# Patient Record
Sex: Female | Born: 1960 | Race: Black or African American | Hispanic: No | Smoking: Never smoker
Health system: Southern US, Community
[De-identification: ages and names within clinical notes are randomized; demographics above are authoritative.]

## PROBLEM LIST (undated history)

## (undated) DIAGNOSIS — R011 Cardiac murmur, unspecified: Secondary | ICD-10-CM

## (undated) DIAGNOSIS — I10 Essential (primary) hypertension: Secondary | ICD-10-CM

## (undated) DIAGNOSIS — Z9889 Other specified postprocedural states: Secondary | ICD-10-CM

## (undated) DIAGNOSIS — K6389 Other specified diseases of intestine: Secondary | ICD-10-CM

## (undated) DIAGNOSIS — R112 Nausea with vomiting, unspecified: Secondary | ICD-10-CM

## (undated) DIAGNOSIS — D649 Anemia, unspecified: Secondary | ICD-10-CM

## (undated) DIAGNOSIS — T4145XA Adverse effect of unspecified anesthetic, initial encounter: Secondary | ICD-10-CM

## (undated) DIAGNOSIS — T8859XA Other complications of anesthesia, initial encounter: Secondary | ICD-10-CM

## (undated) HISTORY — PX: LYMPH NODE BIOPSY: SHX201

## (undated) HISTORY — PX: BREAST EXCISIONAL BIOPSY: SUR124

## (undated) HISTORY — PX: ABDOMINAL HYSTERECTOMY: SHX81

## (undated) HISTORY — DX: Essential (primary) hypertension: I10

## (undated) HISTORY — PX: OTHER SURGICAL HISTORY: SHX169

## (undated) HISTORY — DX: Cardiac murmur, unspecified: R01.1

---

## 2001-12-18 ENCOUNTER — Encounter: Payer: Self-pay | Admitting: Obstetrics and Gynecology

## 2001-12-18 ENCOUNTER — Ambulatory Visit (HOSPITAL_COMMUNITY): Admission: RE | Admit: 2001-12-18 | Discharge: 2001-12-18 | Payer: Self-pay | Admitting: Obstetrics and Gynecology

## 2002-01-10 ENCOUNTER — Encounter (INDEPENDENT_AMBULATORY_CARE_PROVIDER_SITE_OTHER): Payer: Self-pay | Admitting: Specialist

## 2002-01-10 ENCOUNTER — Ambulatory Visit (HOSPITAL_COMMUNITY): Admission: RE | Admit: 2002-01-10 | Discharge: 2002-01-10 | Payer: Self-pay | Admitting: Obstetrics and Gynecology

## 2003-04-20 ENCOUNTER — Other Ambulatory Visit: Admission: RE | Admit: 2003-04-20 | Discharge: 2003-04-20 | Payer: Self-pay | Admitting: Obstetrics and Gynecology

## 2003-05-27 ENCOUNTER — Encounter: Admission: RE | Admit: 2003-05-27 | Discharge: 2003-05-27 | Payer: Self-pay | Admitting: Obstetrics and Gynecology

## 2004-03-29 ENCOUNTER — Encounter (INDEPENDENT_AMBULATORY_CARE_PROVIDER_SITE_OTHER): Payer: Self-pay | Admitting: Specialist

## 2004-03-29 ENCOUNTER — Observation Stay (HOSPITAL_COMMUNITY): Admission: RE | Admit: 2004-03-29 | Discharge: 2004-03-30 | Payer: Self-pay | Admitting: Obstetrics and Gynecology

## 2005-12-22 ENCOUNTER — Encounter: Admission: RE | Admit: 2005-12-22 | Discharge: 2005-12-22 | Payer: Self-pay | Admitting: Family Medicine

## 2006-07-06 ENCOUNTER — Encounter: Admission: RE | Admit: 2006-07-06 | Discharge: 2006-07-06 | Payer: Self-pay | Admitting: Otolaryngology

## 2006-07-06 ENCOUNTER — Encounter (INDEPENDENT_AMBULATORY_CARE_PROVIDER_SITE_OTHER): Payer: Self-pay | Admitting: Specialist

## 2006-07-06 ENCOUNTER — Ambulatory Visit (HOSPITAL_COMMUNITY): Admission: RE | Admit: 2006-07-06 | Discharge: 2006-07-06 | Payer: Self-pay | Admitting: Otolaryngology

## 2007-06-12 ENCOUNTER — Encounter: Admission: RE | Admit: 2007-06-12 | Discharge: 2007-06-12 | Payer: Self-pay | Admitting: Family Medicine

## 2007-11-13 ENCOUNTER — Encounter: Admission: RE | Admit: 2007-11-13 | Discharge: 2007-11-13 | Payer: Self-pay | Admitting: Family Medicine

## 2008-06-12 ENCOUNTER — Encounter: Admission: RE | Admit: 2008-06-12 | Discharge: 2008-06-12 | Payer: Self-pay | Admitting: Family Medicine

## 2010-11-11 NOTE — H&P (Signed)
Amber, Davidson               ACCOUNT NO.:  [id number]   MEDICAL RECORD NO.:  [id number]          PATIENT TYPE:  AMB   LOCATION:  SDC                           FACILITY:  [facility]   PHYSICIAN:  Hal Morales, M.D.DATE OF BIRTH:  05/30/1961   DATE OF ADMISSION:  DATE OF DISCHARGE:                                HISTORY & PHYSICAL   ADDENDUM TO PREVIOUS H&P DICTATION [phone]   LABORATORY DATA:  The patient had a urine pregnancy test on March 22, 2004 which was negative.      EJP/MEDQ  D:  03/23/2004  T:  03/23/2004  Job:  [id number]

## 2010-11-11 NOTE — H&P (Signed)
[facility]  Patient:    Amber Davidson, POL Visit Number: [id number] MRN: [id number]          Service Type: Attending:  Maris Berger. Pennie Rushing, M.D. Dictated by:   Henreitta Leber, P.A.                           History and Physical  INCOMPLETE  DATE OF BIRTH:                30-Mar-1961  HISTORY OF PRESENT ILLNESS:   The patient is a 50 year old married African-American female with new onset dysmenorrhea of seven months duration. Patient states that she has begun to experience one day before her menstrual period severe menstrual cramps which she rates as a 10/10 on a 10-point scale which lasts throughout her three to five day flow.  Patient was found to have a normal pelvic ultrasound on December 19, 2001, along with Dictated by:   Henreitta Leber, P.A. Attending:  Maris Berger. Pennie Rushing, M.D. DD:  01/06/02 TD:  01/06/02 Job: [id number]

## 2010-11-11 NOTE — Op Note (Signed)
[facility]  Patient:    Amber Davidson, Amber Davidson Visit Number: [id number] MRN: [id number]          Service Type: DSU Location: [facility] Attending Physician:  Shaune Spittle Dictated by:   Maris Berger. Pennie Rushing, M.D. Proc. Date: 01/10/02 Admit Date:  01/10/2002 Discharge Date: 01/10/2002                             Operative Report  PREOPERATIVE DIAGNOSES:       Pelvic pain.  POSTOPERATIVE DIAGNOSES:      Pelvic pain, small uterine fibroid, pelvic adhesions, rule out endometriosis.  OPERATION:                    Diagnostic laparoscopy, uterine serosal biopsy, lysis of adhesions.  ANESTHESIA:                   General orotracheal.  ESTIMATED BLOOD LOSS:         Less than 10 cc.  COMPLICATIONS:                None.  FINDINGS:                     The uterus was normal size and contained a 2 cm posterior fundal fibroid.  Along the serosa of the uterus were small white lesions each less than 1 mm.  These same types of lesions were found along each of the fallopian tubes.  The anterior cul-de-sac contained no stigmata of endometriosis or adhesions.  The posterior cul-de-sac contained filmy adhesions between the posterior uterus and the posterior cul-de-sac peritoneum.  There were no powder burn lesions.  There were no peritoneal windows or defects that were consistent with endometriosis.  The right fallopian tube was without any adhesions.  There was an adhesion between the right round ligament and the right utero-ovarian ligament encircling the right fallopian tube.  The right and left ovaries appeared normal.  There were filmy adhesions surrounding the left ovary.  The left tube contained filmy adhesions at the fimbriated end.  The appendix appeared normal.  PROCEDURE:                    The patient was taken to the operating room after appropriate identification and placed on the operating table.  After the attainment of adequate general  anesthesia, she was placed in the modified lithotomy position.  The abdomen, perineum, and vagina were prepped with multiple layers of Betadine and a Foley catheter placed into the bladder under sterile conditions and connected to straight drainage.  A single tooth tenaculum was placed on the anterior cervix.  The abdomen was draped as a sterile field.  Subumbilical and suprapubic injections of 0.25% Marcaine for a total of 10 cc was undertaken.  A subumbilical incision was made.  A Veress cannula was placed through that incision into the peritoneal cavity.  A pneumoperitoneum was created with 3 L of CO2.  The Veress cannula was removed and the laparoscopic trocar placed through the umbilical incision and a laparoscope placed through the trocar sleeve.  Suprapubic incisions were made to the right and left of midline and laparoscopic probe trocars placed through those incisions into the peritoneal cavity under direct visualization.  The above noted findings were made and documented.  The adhesions were lysed and as many of them as possible removed from the operative field and sent  for evaluation of endometriosis.  A biopsy from the posterior uterus of one of the aforementioned white lesions was then undertaken and the defect made hemostatic with bipolar cautery.  Copious irrigation was carried out and once lysis of adhesions was deemed to be complete, 100 cc of warm lactated Ringers was left in the peritoneal cavity and the instruments were removed from the peritoneal cavity under direct visualization as the CO2 was allowed to escape. During the operative procedure the patient was given 2 g of Cefotan intravenously.  Subumbilical and suprapubic incisions were closed with subcuticular sutures of 3-0 Vicryl.  Sterile dressings were applied.  The single tooth tenaculum and Foley catheter were removed.  Silver nitrate was used at the tenaculum site to allow adequate hemostasis.  The patient  was awakened from general anesthesia and taken to the recovery room in satisfactory condition having tolerated the procedure well with sponge and instrument counts correct.  SPECIMEN:                     Peritoneal adhesions and uterine biopsy. Dictated by:   Maris Berger. Pennie Rushing, M.D. Attending Physician:  Shaune Spittle DD:  01/10/02 TD:  01/15/02 Job: [id number]

## 2010-11-11 NOTE — H&P (Signed)
[facility]  Patient:    Amber Davidson, Amber Davidson Visit Number: [id number] MRN: [id number]          Service Type: Attending:  Maris Berger. Pennie Rushing, M.D. Dictated by:   Henreitta Leber, P.A.-C. Adm. Date:  01/10/02                           History and Physical  DATE OF BIRTH:                07-29-1960  HISTORY OF PRESENT ILLNESS:   Amber Davidson is a 50 year old married African-American female with a seven-month history of new onset dysmenorrhea. The patient noted that one day prior to her menstrual period throughout her 3 to 5-day flow, she has what she describes as 10/10 intense menstrual cramps (on a 10-point scale) which is only minimally relieved by a heating pad and over-the-counter analgesia.  The patients flow requires her to utilize four maxipads per day and, as previously mentioned, may last as long as five days. She underwent a pelvic ultrasound on December 19, 2001, and was found to have normal pelvic structures.  A GC and chlamydia culture of January 02, 2002, was found to be negative.  The patient has consented to undergo diagnostic laparoscopy for further evaluation of her pain.  PAST OBSTETRICAL HISTORY:     Gravida 3, para 2-0-1-2; the patient experienced anemia during her pregnancies.  GYNECOLOGIC HISTORY:          Menarche at 50 years old.  The patients menstrual periods are regular with severe cramps (see History of Present Illness).  Contraception: None.  The patient has a remote history of gonorrhea and Trichomonas.  She has no history of an abnormal Pap smear.  She does have a history of a left ovarian cyst.  The patients last Pap smear and mammogram were June 2003, and both were within normal limits.  PAST MEDICAL HISTORY:         Positive for hypertension.  PAST SURGICAL HISTORY:        Negative.  The patient has never had a blood transfusion.  FAMILY HISTORY:               Positive for heart disease, breast cancer (maternal aunt and  mother, both postmenopausal), sarcoidosis, and hypertension.  SOCIAL HISTORY:               The patient is married and works as an Statistician.  The patient does not use tobacco or alcohol.  CURRENT MEDICATIONS:          Triamterene/HCTZ 37.5/25 1 tablet daily.  ALLERGIES:                    The patient is allergic to PENICILLIN which causes hives.  REVIEW OF SYSTEMS:            Positive for biweekly frontal headache, dyspareunia.  The patient does wear glasses.  The patient has problems with constipation on occasion.  Otherwise, except as mentioned in History of Present Illness, Review of Systems is negative.  PHYSICAL EXAMINATION:  VITAL SIGNS:                  Blood pressure 100/80, weight 169.  Height 5 feet 6 inches.  HEENT:                        Ear, nose, and  throat exam is within normal limits.  NECK:                         Supple without masses.  There is no thyromegaly.  HEART:                        Regular rate and rhythm.  There is no murmur.  LUNGS:                        Clear to auscultation.  There are no wheezes, rales, or rhonchi.  BACK:                         Without CVA tenderness.  ABDOMEN:                      Bowel sounds are present.  It is soft without tenderness or hepatosplenomegaly.  EXTREMITIES:                  Without clubbing, cyanosis, or edema.  PELVIC:                       EG/BUS within normal limits.  Vagina is normal. Cervix is nontender without lesions.  Uterus appears upper limits of normal size and is tender.  Adnexa has no palpable masses, though the patient does have right adnexal tenderness.  Rectovaginal exam confirms.  IMPRESSION:                   Severe dysmenorrhea.  DISPOSITION:                  The implications for, along with the risks associated with chosen procedure was reviewed with the patient to include reaction to anesthesia, damage to adjacent organs, infection, and bleeding. The patient has  consented to undergo laparoscopy at [facility] [place] on January 10, 2002, at 12:15 p.m. Dictated by:   Henreitta Leber, P.A.-C. Attending:  Maris Berger. Pennie Rushing, M.D. DD:  01/06/02 TD:  01/06/02 Job: [id number]

## 2010-11-11 NOTE — Op Note (Signed)
Amber Davidson, Amber Davidson               ACCOUNT NO.:  [id number]   MEDICAL RECORD NO.:  [id number]          PATIENT TYPE:  AMB   LOCATION:  SDS                          FACILITY:  [facility]   PHYSICIAN:  Karol T. Lazarus Salines, M.D. DATE OF BIRTH:  January 17, 1961   DATE OF PROCEDURE:  07/06/2006  DATE OF DISCHARGE:  07/06/2006                               OPERATIVE REPORT   PREOPERATIVE DIAGNOSIS:  Right neck suppurative lymphadenitis.   POSTOPERATIVE DIAGNOSIS:  Right neck suppurative lymphadenitis.   PROCEDURE PERFORMED:  Incision and drainage right neck suppurative node  including tissue biopsy and cultures.   SURGEON:  Gloris Manchester. Lazarus Salines, M.D.   ANESTHESIA:  General LMA.   ESTIMATED BLOOD LOSS:  5 mL.   COMPLICATIONS:  None.   FINDINGS:  Firm inflammatory mass in the right neck level 2 deep to the  sternocleidomastoid muscle.  A frank pus containing access cavity in a  thick wall node containing perhaps 3-4 mL of pus.  Cultures were sent  for aerobic anaerobic, acid fast, fungus.  Biopsy of the wall sent for  pathologic interpretation.   DESCRIPTION OF PROCEDURE:  With the patient in a comfortable supine  position, general LMA anesthesia was administered without difficulty.  At an appropriate level, the patient was placed in a slight reversed  Trendelenburg.  The head was rotated to the left for access to the right  neck.  A skin wrinkle was identified.  1% Xylocaine with 1:100,000  epinephrine, 4 mL total, was infiltrated into the skin wrinkle for  interoperative hemostasis.  A full sterile preparation and draping of  the neck was accomplished.   A 3 cm incision was sharply executed in the previously identified  wrinkle and carried down to the skin and subcutaneous fat.  This was  carried through the platysmal muscle.  A branch of the external jugular  vein was identified and controlled with silk ligatures.  Upon arriving  at the capsule of the mass, a hemostat was used to dissect  bluntly  through the tissue and a pus containing cavity was entered.  Cultures,  as above, were obtained.  The opening was bluntly enlarged.  The cavity  was irrigated with saline.  Hemostasis was observed.  The cavity was  packed with 1/2 inch Iodoform Nugauze approximately 12 inches.  Hemostasis was observed again.  The external wound was loosely closed at  the corners with 4-0 Ethilon vertical mattress sutures.  The Iodoform  Nugauze was secured at the center of the wound with the same suture.  Again, hemostasis was observed.  A 4 inch Kerlix fluff and 3 inch Ace  wrap dressing was applied.  The patient was returned to anesthesia,  awakened, extubated, and transferred to recovery in stable condition.   COMMENT:  50 year old black female with a several week history of tender  swelling in the right neck, rapidly progressive over the past 3-4 days  and failing to show dramatic improvement on clindamycin therapy with a  CT scan showing an abscessed cavity in a presumed lymph node was the  indications for today's procedure.  Anticipated  routine postoperative recovery with attention to ice, elevation,  dressing changes, and continued antibiotics and pain medication.  Given  low anticipated risk of post anesthetic or post surgical complications,  I feel an outpatient venue is appropriate.      Gloris Manchester. Lazarus Salines, M.D.  Electronically Signed     KTW/MEDQ  D:  07/06/2006  T:  07/07/2006  Job:  [id number]   cc:   Gretta Arab. Valentina Lucks, M.D.

## 2010-11-11 NOTE — Op Note (Signed)
Amber Davidson, Amber Davidson               ACCOUNT NO.:  [id number]   MEDICAL RECORD NO.:  [id number]          PATIENT TYPE:  OBV   LOCATION:  9399                          FACILITY:  [facility]   PHYSICIAN:  Hal Morales, M.D.DATE OF BIRTH:  [birth year]   DATE OF PROCEDURE:  03/29/2004  DATE OF DISCHARGE:                                 OPERATIVE REPORT   PREOPERATIVE DIAGNOSIS:  1.  Pelvic pain.  2.  History of pelvic adhesions.   POSTOPERATIVE DIAGNOSIS:  1.  Pelvic pain.  2.  History of pelvic adhesions.  3.  Right corpus luteum cyst.   PROCEDURE:  Diagnostic laparoscopy, total vaginal hysterectomy.   SURGEON:  Hal Morales, M.D.   FIRST ASSISTANT:  Osborn Coho, M.D.   ANESTHESIA:  General orotracheal.   ESTIMATED BLOOD LOSS:  300 mL.   COMPLICATIONS:  None.   FINDINGS:  The uterus was upper limits of normal size with small myomata  that was subserosal.  The left ovary was within normal limits.  The right  ovary contained a 3 cm corpus luteum cyst without any excrescences or  adhesions.  The uterus weighed 170.6 g.   DESCRIPTION OF PROCEDURE:  The procedure was taken to the operating room  after appropriate identification and placed on the operating table.  After  the attainment of adequate general anesthesia, she was placed in the  modified lithotomy position.  The abdomen, perineum and vagina were prepped  with multiple layers of Betadine, and Foley catheter inserted into the  bladder and connected to straight drainage.  A Hulka tenaculum was placed on  the cervix and the abdomen and peritoneum draped as a sterile field.  Subumbilical and suprapubic injection of 0.25% Marcaine was undertaken and a  subumbilical incision was made.  A Veress cannula was placed through that  incision into the peritoneal cavity.  Pneumoperitoneum was created with 4  liters of CO2.  The Veress cannula was removed and laparoscopic trocar  placed through that incision into the peritoneal  cavity.  The laparoscope  was placed through the trocar sleeve.  Suprapubic incisions were made to the  right and left of midline and laparoscopic probe trocars placed through  those incisions into the peritoneal cavity under direct visualization.  The  above noted findings were made and documented.  The right utero-ovarian  ligament and uterotubal junction were cauterized and cut.  The right round  ligament was cauterized and cut.  At that time, decision was made to proceed  with total vaginal hysterectomy.  All laparoscopic instruments were left in  place and the perineum became the site of operation.  A weighted speculum  was placed in the vagina and Lahey tenacula placed on the anterior and  posterior lips of the cervix.  The cervical vaginal mucosa was injected with  a dilute solution of Pitressin.  The circumflex was circumscribed and the  anterior vaginal mucosa was bluntly dissected off the anterior cervix.  The  anterior peritoneum was entered sharply and a retractor placed.  The  posterior peritoneum was sharply entered and tagged and a weighted vaginal  speculum was placed in the posterior cul-de-sac.  The uterosacral ligaments  on the right and left side were clamped, cut and suture ligated.  The  paracervical tissues were clamped, cut and suture ligated.  The parametrial  tissues were then clamped, cut and suture ligated, all with sutures of 0  Vicryl.  At that time, the uterus was morcellated and the upper pedicles  reached.  Since they had been cauterized on the right side, the left side  was then clamped, cut, and tied with tied with free tie and suture ligated  and the remaining uterus removed from the operative field.  Hemostatic  sutures were placed in the vaginal cuff to allow for adequate hemostasis.  The sutures that had been held on the uterosacral ligaments were used to  create the vaginal angles anteriorly and posteriorly and tied down to free  the vaginal angles.   McCall culdoplasty sutures were then placed in the  posterior cul-de-sac incorporating the two uterosacral ligaments and the  intervening posterior peritoneum.  Those sutures were held.  The remainder  of the vaginal cuff was closed in a single layer incorporating anterior  vaginal mucosa, anterior peritoneum, posterior peritoneum and posterior  vaginal mucosa in each figure-of-eight suture of 0 Vicryl.  Once the cuff  had been closed, the McCall culdoplasty sutures were tied down and  hemostasis noted to be adequate.  Vaginal packing with 2 inch plain  moistened with Estrace cream was placed in the vagina.  At that time, I  returned to the laparoscopy and was able to visualize the pelvis and noted  it to be hemostatic.  Irrigation was carried out and all instruments removed  from the peritoneal cavity under direct visualization as the CO2 was allowed  to escape.  The subumbilical and right suprapubic incision were closed with  fascial sutures of 0 Vicryl and subcuticular sutures of 3-0 Vicryl.  The  left suprapubic incision was closed with a subcuticular suture of 3-0  Vicryl.  Hemostasis was noted to be adequate.  Sterile dressings were  applied.  The patient was awakened from general anesthesia and taken to the  recovery room in satisfactory condition having tolerated the procedure well  with sponge and instrument counts correct.  Specimens to pathology were  morcellated uterus and cervix.      VPH/MEDQ  D:  03/29/2004  T:  03/29/2004  Job:  [id number]

## 2010-11-11 NOTE — H&P (Signed)
NAMEGEOVANNA, Davidson               ACCOUNT NO.:  [id number]   MEDICAL RECORD NO.:  [id number]          PATIENT TYPE:  AMB   LOCATION:  SDC                           FACILITY:  [facility]   PHYSICIAN:  Hal Morales, M.D.DATE OF BIRTH:  October 13, 1960   DATE OF ADMISSION:  DATE OF DISCHARGE:                                HISTORY & PHYSICAL   HISTORY OF PRESENT ILLNESS:  [name] is a 50 year old married African-  American female para 2-0-1-2 who presents for a laparoscopic-assisted  vaginal hysterectomy because of pelvic pain.  Mrs. Lemarr has a long history  of dysmenorrhea with pelvic pain which was evaluated laparoscopically in  2003.  Findings of that procedure showed adhesions but no endometriosis.  The patient was placed on Depo-Provera following that procedure which  yielded relief of her pain, along with amenorrhea.  The patient received her  last dose of Depo-Provera in November 2003 which she stopped because of  severe vasomotor symptoms in spite of the addition of Climara patch 0.025 mg  weekly.  The patient remained essentially amenorrheic with periodic spotting  until August 2005 when she bled approximately 10 consecutive days, having to  change her pad hourly.   For the past 7 months the patient has noticed an almost daily crampy pain  which she rates as a 5/10 on a 10-point scale which will then increase in a  cyclical fashion to 9/10 (when she normally would expect a period).  The  patient has only found relief with Ponstel 250 mg which decreases her daily  pain to 3/10 and her episodic exacerbation to 7/10 on a 10-point scale.  The  patient denies any change in her bowel habits, urinary tract symptoms,  nausea, vomiting, diarrhea, fever, and she also denies having any sexual  activity.  During this time of the patient being evaluated for pelvic pain  and menstrual irregularity, she has had negative gonorrhea and chlamydia  cultures, normal TSH and prolactin, and a  nonmenopausal FSH.  Due to the  chronicity of her pain and disruption of her lifestyle the patient, after  reviewing both medical and surgical options for management, has decided to  proceed with definitive therapy in the form of hysterectomy.   PAST MEDICAL HISTORY:  1.  OB history:  Gravid 3 para 2-0-1-2.  The patient had anemia during      pregnancy.  2.  GYN history:  Menarche at 50 years old.  The patient's normal menstrual      flow (prior to Depo-Provera) was for 3 days in which she used three pads      per day.  Her last menstrual period is February 04, 2004.  The patient      uses abstinence as a method of contraception.  She has a remote history      of gonorrhea and Trichomonas.  Denies any history of abnormal Pap      smears.  Last normal Pap smear was November 2004.  Last normal mammogram      December 2004.  3.  Medical history:  Positive for left ovarian cyst  and hypertension.  4.  Surgical history:  In 1987, D&C.  In 2003, laparoscopy for pelvic pain,      revealing pelvic adhesions.  Denies any history of blood transfusion.      The patient does state that general anesthesia causes her severe nausea      and difficulty awakening postoperatively.   FAMILY HISTORY:  Positive for heart disease, breast cancer (postmenopausal  maternal aunt and mother), sarcoidosis, and hypertension.   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   HABITS:  The patient does not smoke or use alcohol.   CURRENT MEDICATIONS:  1.  Ponstel 250 mg one tablet q.6h. as needed for pain.  2.  Maxzide 37.5/25 one tablet daily.   ALLERGIES:  The patient is allergic to PENICILLIN which causes a rash, and  VICODIN which causes severe nausea and vomiting.   REVIEW OF SYSTEMS:  Negative except as mentioned in the history of present  illness.   PHYSICAL EXAMINATION:  VITAL SIGNS:  Blood pressure 118/80, weight is 180,  height is 5 feet 6  inches tall.  NECK:  Supple.  There is no thyromegaly, adenopathy, or palpable masses.  HEART:  Regular rate and rhythm, there is no murmur.  LUNGS:  Clear to auscultation.  There are no wheezes, rales, or rhonchi.  BACK:  No CVA tenderness.  ABDOMEN:  Bowel sounds are present.  It is soft.  The patient does have  right lower quadrant tenderness.  However, there is no guarding, rebound, or  organomegaly.  EXTREMITIES:  Without clubbing, cyanosis, or edema.  PELVIC:  EG/BUS is within normal limits.  The vagina is rugous.  Cervix is  nontender without lesions.  The uterus is normal size, shape, and  consistency, and tender.  Adnexa without any palpable masses, though the  right adnexa is tender.  Rectovaginal exam confirms.   IMPRESSION:  1.  Pelvic pain.  2.  History of pelvic adhesions.   DISPOSITION:  A discussion was held with the patient regarding the  indications for her procedure as well as the fact that total relief from her  pelvic pain could not be guaranteed with this procedure.  The patient  verbalized understanding and is willing to accept the risks, which include  but are not limited to:  Reaction to anesthesia, damage to adjacent organs,  infection, and excessive bleeding.  The patient was given a copy of the ACOG  brochure entitled, Understanding Hysterectomy.  She was also advised and  given instructions for a Fleets Phospho-Soda bowel prep which she will  administer the day prior to surgery.  The patient has consented and is  scheduled to undergo a laparoscopic-assisted vaginal hysterectomy at [facility] [place] on March 29, 2004.      EJP/MEDQ  D:  03/23/2004  T:  03/23/2004  Job:  [id number]

## 2010-11-11 NOTE — Discharge Summary (Signed)
Amber Davidson, Amber Davidson               ACCOUNT NO.:  [id number]   MEDICAL RECORD NO.:  [id number]          PATIENT TYPE:  OBV   LOCATION:  9308                          FACILITY:  [facility]   PHYSICIAN:  Amber Davidson, M.D.DATE OF BIRTH:  1961-03-15   DATE OF ADMISSION:  03/29/2004  DATE OF DISCHARGE:  03/30/2004                                 DISCHARGE SUMMARY   DISCHARGE DIAGNOSES:  1.  Pelvic pain.  2.  History of pelvic adhesions.  3.  Right corpus luteum cyst.   OPERATION:  On the day of admission, the patient underwent diagnostic  laparoscopy and a total vaginal hysterectomy, tolerating both procedures  well.  The patient was found to have a uterus which was upper limits of  normal size with small myomata that was subserosal.  The left ovary was  within normal limits.  The right ovary contained a 3 cm corpus luteum cyst  without any excrescences or adhesions.  The uterus weighed 170.6 g.   HISTORY OF PRESENT ILLNESS:  Amber Davidson is a 50 year old married African  American female para 2-0-1-2 who presents for laparoscopically assisted  vaginal hysterectomy because of pelvic pain.  Please see the patient's  dictated History and Physical examination for details.   PREOPERATIVE PHYSICAL EXAMINATION:  VITAL SIGNS:  Blood pressure 118/80,  weight 180, height 5 feet 6 inches tall.  GENERAL APPEARANCE:  Within normal limits.  ABDOMEN:  Bowel sounds were present.  Abdomen was soft.  Some right lower  quadrant tenderness without guarding, rebound or organomegaly.  PELVIC:  EG/BUS was within normal limits.  Vagina was rugose.  Cervix was  nontender without lesions.  Uterus was normal size, shape and consistency  and tender.  Adnexa without any palpable masses.  Though, the right adnexa  was somewhat tender.  RECTAL/VAGINAL:  Confirmed.   HOSPITAL COURSE:  On the day of admission, the patient underwent  aforementioned procedures, tolerating them all well.  Her postop hemoglobin  was 10.6  (preop hemoglobin 13.9).  By postop day #1, the patient had resumed  bowel and bladder function and had therefore achieved a maximum benefit of  her hospital stay, and was therefore being admitted for discharge home.   DISCHARGE MEDICATIONS:  1.  Maxzide 37.5/25 one tablet daily.  2.  Ferrous sulfate 325 mg one tablet daily.  3.  Percocet 5/325 1-2 q.4h. p.r.n. pain.  4.  Ibuprofen 600 mg one tablet q.6h. for three days then as needed.   FOLLOWUP:  The patient is to call [facility] for her six weeks  postoperative visit with Dr. Pennie Rushing.   DISCHARGE INSTRUCTIONS:  The patient was given a copy of [facility] postoperative instruction sheet.  She was further advised to avoid  driving for two weeks, heavy lifting for four weeks and intercourse for six  weeks.  The patient's diet was without restriction.   FINAL PATHOLOGY:  UTERUS AND CERVIX:  Cervix:  Squamous metaplasia,  secretory endometrium, no hyperplasia or carcinoma identified, benign  uterine serosa.  No endometriosis or malignancy identified.     Elmi  EJP/MEDQ  D:  04/12/2004  T:  04/13/2004  Job:  [id number]

## 2011-02-06 ENCOUNTER — Other Ambulatory Visit (HOSPITAL_COMMUNITY): Payer: Self-pay | Admitting: Family Medicine

## 2011-02-06 DIAGNOSIS — Z1231 Encounter for screening mammogram for malignant neoplasm of breast: Secondary | ICD-10-CM

## 2011-02-16 ENCOUNTER — Other Ambulatory Visit (HOSPITAL_COMMUNITY): Payer: Self-pay | Admitting: Family Medicine

## 2011-02-16 DIAGNOSIS — Z1231 Encounter for screening mammogram for malignant neoplasm of breast: Secondary | ICD-10-CM

## 2011-02-17 ENCOUNTER — Ambulatory Visit (HOSPITAL_COMMUNITY): Payer: Self-pay

## 2011-02-17 ENCOUNTER — Ambulatory Visit
Admission: RE | Admit: 2011-02-17 | Discharge: 2011-02-17 | Disposition: A | Discharge status: Home / Self Care | Payer: BC Managed Care – PPO | Source: Ambulatory Visit | Attending: Family Medicine | Admitting: Family Medicine

## 2011-02-17 DIAGNOSIS — Z1231 Encounter for screening mammogram for malignant neoplasm of breast: Secondary | ICD-10-CM

## 2012-11-01 ENCOUNTER — Other Ambulatory Visit: Payer: Self-pay

## 2012-11-01 DIAGNOSIS — Z1231 Encounter for screening mammogram for malignant neoplasm of breast: Secondary | ICD-10-CM

## 2012-12-16 ENCOUNTER — Ambulatory Visit
Admission: RE | Admit: 2012-12-16 | Discharge: 2012-12-16 | Disposition: A | Discharge status: Home / Self Care | Payer: BC Managed Care – PPO | Source: Ambulatory Visit

## 2012-12-16 DIAGNOSIS — Z1231 Encounter for screening mammogram for malignant neoplasm of breast: Secondary | ICD-10-CM

## 2014-01-02 ENCOUNTER — Other Ambulatory Visit: Payer: Self-pay | Admitting: Gastroenterology

## 2014-01-05 ENCOUNTER — Other Ambulatory Visit: Payer: Self-pay | Admitting: Gastroenterology

## 2014-01-05 DIAGNOSIS — K6389 Other specified diseases of intestine: Secondary | ICD-10-CM

## 2014-01-07 ENCOUNTER — Ambulatory Visit
Admission: RE | Admit: 2014-01-07 | Discharge: 2014-01-07 | Disposition: A | Discharge status: Home / Self Care | Payer: BC Managed Care – PPO | Source: Ambulatory Visit | Attending: Gastroenterology | Admitting: Gastroenterology

## 2014-01-07 DIAGNOSIS — K6389 Other specified diseases of intestine: Secondary | ICD-10-CM

## 2014-01-07 MED ORDER — IOHEXOL 300 MG/ML  SOLN
100.0000 mL | Freq: Once | INTRAMUSCULAR | Status: AC | PRN
  Administered 2014-01-07: 100 mL via INTRAVENOUS

## 2014-01-14 ENCOUNTER — Ambulatory Visit (INDEPENDENT_AMBULATORY_CARE_PROVIDER_SITE_OTHER): Payer: BC Managed Care – PPO | Admitting: Surgery

## 2014-01-14 ENCOUNTER — Encounter (INDEPENDENT_AMBULATORY_CARE_PROVIDER_SITE_OTHER): Payer: Self-pay | Admitting: Surgery

## 2014-01-14 ENCOUNTER — Telehealth (INDEPENDENT_AMBULATORY_CARE_PROVIDER_SITE_OTHER): Payer: Self-pay

## 2014-01-14 VITALS — BP 126/76 | HR 70 | Temp 97.4°F | Ht 66.0 in | Wt 174.0 lb

## 2014-01-14 DIAGNOSIS — K639 Disease of intestine, unspecified: Secondary | ICD-10-CM

## 2014-01-14 DIAGNOSIS — K6389 Other specified diseases of intestine: Secondary | ICD-10-CM

## 2014-01-14 NOTE — Patient Instructions (Signed)
COLON BOWEL PREP                                                                          °Please follow the instructions carefully. It is important to clean out your bowels & take the prescribed antibiotic pills to lower your chances of a wound infection or abscess.  ° °FIVE DAYS PRIOR TO YOUR SURGERY ° °Stop eating any nuts, popcorn, or fruit with seeds. Stop all fiber supplements such as Metamucil, Citrucel, etc.   °Hold taking any blood thinning anticoagulation medication (ex: aspirin, warfarin/Coumadin, Plavix, Xarelto, Eliquis, Pradaxa, etc) as recommended by your medical/cardiology doctor ° °Obtain what you need at a pharmacy of your choice: °-Filled out prescriptions for your oral antibiotics (Neomycin & Metronidazole)  °-A bottle of MiraLax / Glycolax (288g) - no prescription required  °-A large bottle of Gatorade / Powerade (64oz)  °-Dulcolax tablets (4 tabs) - no prescription required  ° ° °DAY PRIOR TO SURGERY  ° °7:00am °Swallow 4 Dulcolax tablets with some water °Drink plenty of clear liquids all day to avoid getting dehydrated (Water, juice, soda, coffee, tea, bouillon, jello, etc.) ° °10:00am °Mix the bottle of MiraLax with the 64-oz bottle of Gatorade.  Drink the Gatorade mixture gradually over the next few hours (8oz glass every 15-30 minutes) until gone. You should finish by 2pm. ° °2:00pm °Take 2 Neomycin 500mg tablets & 2 Metronidazole 500mg tablets ° °3:00pm °Take 2 Neomycin 500mg tablets & 2 Metronidazole 500mg tablets  °Drink plenty of clear liquids all evening to avoid getting dehydrated ° °10:00pm °Take 2 Neomycin 500mg tablets & 2 Metronidazole 500mg tablets  °Do not eat or drink anything after bedtime (midnight) the night before your surgery. ° ° °MORNING OF SURGERY °Remember to not to drink or eat anything that morning  °Hold or take medications as recommended by the hospital staff at your Preoperative visit ° °If you have questions or concerns, please call [facility] [phone] during business hours to speak to the clinical staff for advice. °

## 2014-01-14 NOTE — Progress Notes (Signed)
General Surgery [facility]  Chief Complaint  Patient presents with  . 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  Patient was sent for screening colonoscopy do to age. She has had no symptoms. She has had borderline anemia in the past. She has noted no bleeding. She has had no change in bowel habits.  Patient underwent colonoscopy on 01/02/2014. She was found to have an 8 cm mass at the hepatic flexure of the colon. Biopsies were taken showing a tubulovillous adenoma without atypia or evidence of malignancy. A CT scan of the abdomen and pelvis showed no worrisome findings. Patient is now referred for surgical resection for management and definitive diagnosis.  Past Medical History  Diagnosis Date  . Heart murmur   . Hypertension     Current Outpatient Prescriptions  Medication Sig Dispense Refill  . benazepril-hydrochlorthiazide (LOTENSIN HCT) 10-12.5 MG per tablet        No current facility-administered medications for this visit.    Allergies  Allergen Reactions  . Penicillins     Family History  Problem Relation Age of Onset  . Cancer Mother     breast    History   Social History  . Marital Status: Married    Spouse Name: N/A    Number of Children: N/A  . Years of Education: N/A   Social History Main Topics  . Smoking status: Never Smoker   . Smokeless tobacco: None  . Alcohol Use: Yes  . Drug Use: No  . Sexual Activity: None   Other Topics Concern  . None   Social History Narrative  . None    REVIEW OF SYSTEMS - PERTINENT POSITIVES ONLY: Denies change in bowel habits. Denies bleeding per rectum. Denies abdominal pain.  EXAMDanley Danker Vitals:   01/14/14 1207  BP: 126/76  Pulse: 70  Temp: 97.4  F (36.3 C)    GENERAL: well-developed, well-nourished, no acute distress HEENT: normocephalic; pupils equal and reactive; sclerae clear; dentition good; mucous membranes moist NECK:  No palpable masses in the thyroid bed; well-healed surgical wound right submandibular region; symmetric on extension; no palpable anterior or posterior cervical lymphadenopathy; no supraclavicular masses; no tenderness CHEST: clear to auscultation bilaterally without rales, rhonchi, or wheezes CARDIAC: regular rate and rhythm without significant murmur; peripheral pulses are full ABDOMEN: soft without distension; bowel sounds present; no mass; no hepatosplenomegaly; no hernia EXT:  non-tender without edema; no deformity NEURO: no gross focal deficits; no sign of tremor   LABORATORY RESULTS: See [facility] (CHL-Epic) for most recent results  RADIOLOGY RESULTS: See [facility] (CHL-Epic) for most recent results  IMPRESSION: 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 I have recommended right colectomy. We discussed the risk and benefits of the procedure. We discussed other options for management. We discussed potential complications including wound infection. We discussed the hospital stay to be anticipated and the postoperative recovery and return to work and activities. Patient understands and wishes to proceed with surgery in the near future. We will make arrangements at a time convenient for the patient.  The risks and benefits of the procedure have been discussed at length with the patient.  The patient understands  the proposed procedure, potential alternative treatments, and the course of recovery to be expected.  All of the patient's questions have been answered at this time.  The patient wishes to proceed with surgery.  Earnstine Regal, MD, [facility]  Primary  Care Physician: Osborne Casco, MD

## 2014-01-14 NOTE — Telephone Encounter (Signed)
Pt given miralax colon prep and antibiotic rx.

## 2014-01-23 ENCOUNTER — Encounter (HOSPITAL_COMMUNITY)
Admission: RE | Admit: 2014-01-23 | Discharge: 2014-01-23 | Disposition: A | Discharge status: Home / Self Care | Payer: BC Managed Care – PPO | Source: Ambulatory Visit | Attending: Surgery | Admitting: Surgery

## 2014-01-23 ENCOUNTER — Encounter (HOSPITAL_COMMUNITY): Payer: Self-pay

## 2014-01-23 ENCOUNTER — Ambulatory Visit (HOSPITAL_COMMUNITY)
Admission: RE | Admit: 2014-01-23 | Discharge: 2014-01-23 | Disposition: A | Discharge status: Home / Self Care | Payer: BC Managed Care – PPO | Source: Ambulatory Visit | Attending: Anesthesiology | Admitting: Anesthesiology

## 2014-01-23 ENCOUNTER — Encounter (HOSPITAL_COMMUNITY): Payer: Self-pay | Admitting: Pharmacy Technician

## 2014-01-23 DIAGNOSIS — K639 Disease of intestine, unspecified: Secondary | ICD-10-CM | POA: Insufficient documentation

## 2014-01-23 DIAGNOSIS — Z01818 Encounter for other preprocedural examination: Secondary | ICD-10-CM | POA: Insufficient documentation

## 2014-01-23 HISTORY — DX: Other complications of anesthesia, initial encounter: T88.59XA

## 2014-01-23 HISTORY — DX: Adverse effect of unspecified anesthetic, initial encounter: T41.45XA

## 2014-01-23 HISTORY — DX: Other specified postprocedural states: Z98.890

## 2014-01-23 HISTORY — DX: Nausea with vomiting, unspecified: R11.2

## 2014-01-23 HISTORY — DX: Other specified diseases of intestine: K63.89

## 2014-01-23 LAB — BASIC METABOLIC PANEL
Anion gap: 10 (ref 5–15)
BUN: 7 mg/dL (ref 6–23)
CO2: 24 mEq/L (ref 19–32)
Calcium: 9 mg/dL (ref 8.4–10.5)
Chloride: 103 mEq/L (ref 96–112)
Creatinine, Ser: 0.72 mg/dL (ref 0.50–1.10)
GFR calc Af Amer: >90 mL/min (ref >90)
GFR calc non Af Amer: >90 mL/min (ref >90)
Glucose, Bld: 83 mg/dL (ref 70–99)
Potassium: 4.1 mEq/L (ref 3.7–5.3)
Sodium: 137 mEq/L (ref 137–147)

## 2014-01-23 LAB — CBC
HCT: 36.2 % (ref 36.0–46.0)
Hemoglobin: 12.2 g/dL (ref 12.0–15.0)
MCH: 28 pg (ref 26.0–34.0)
MCHC: 33.7 g/dL (ref 30.0–36.0)
MCV: 83.2 fL (ref 78.0–100.0)
Platelets: 315 10*3/uL (ref 150–400)
RBC: 4.35 MIL/uL (ref 3.87–5.11)
RDW: 15.7 % — ABNORMAL HIGH (ref 11.5–15.5)
WBC: 7.8 10*3/uL (ref 4.0–10.5)

## 2014-01-23 LAB — ABO/RH: ABO/RH(D): B POS

## 2014-01-23 NOTE — Patient Instructions (Signed)
Amber Davidson  01/23/2014   Your procedure is scheduled on:  8-6 -2015 Thursday at [place] through [facility] Entrance and follow signs to [facility]. Arrive at 0530       AM.  Call this number if you have problems the morning of surgery: [phone]  Or Presurgical Testing [phone](Romone Shaff) For Living Will and/or Health Care Power Attorney Forms: please provide copy for your medical record,may bring AM of surgery(Forms should be already notarized -we do not provide this service).(01-23-14 Yes/will provide copy  Day of surgery 01-29-14  If can locate).  Remember: Follow any bowel prep instructions per MD office.    Do not eat food:After Midnight.     Take these medicines the morning of surgery with A SIP OF WATER: none   Do not wear jewelry, make-up or nail polish.  Do not wear lotions, powders, or perfumes. You may wear deodorant.  Do not shave 48 hours(2 days) prior to first CHG shower(legs and under arms).(Shaving face and neck okay.)  Do not bring valuables to the hospital.(Hospital is not responsible for lost valuables).  Contacts, dentures or removable bridgework, body piercing, hair pins may not be worn into surgery.  Leave suitcase in the car. After surgery it may be brought to your room.  For patients admitted to the hospital, checkout time is 11:00 AM the day of discharge.(Restricted visitors-Any Persons displaying flu-like symptoms or illness).    Patients discharged the day of surgery will not be allowed to drive home. Must have responsible person with you x 24 hours once discharged.  Name and phone number of your driver: [name] [phone] cell  Special Instructions: CHG(Chlorhedine 4%-"Hibiclens","Betasept","Aplicare") Shower Use Special Wash: see special instructions.(avoid face and genitals)    Remember : Type/Screen "Blue armbands" - may not be removed once applied(would result in being retested AM of surgery, if  removed).     ____________________    [facility] - Preparing for Surgery Before surgery, you can play an important role.  Because skin is not sterile, your skin needs to be as free of germs as possible.  You can reduce the number of germs on your skin by washing with CHG (chlorahexidine gluconate) soap before surgery.  CHG is an antiseptic cleaner which kills germs and bonds with the skin to continue killing germs even after washing. Please DO NOT use if you have an allergy to CHG or antibacterial soaps.  If your skin becomes reddened/irritated stop using the CHG and inform your nurse when you arrive at Short Stay. Do not shave (including legs and underarms) for at least 48 hours prior to the first CHG shower.  You may shave your face/neck. Please follow these instructions carefully:  1.  Shower with CHG Soap the night before surgery and the  morning of Surgery.  2.  If you choose to wash your hair, wash your hair first as usual with your  normal  shampoo.  3.  After you shampoo, rinse your hair and body thoroughly to remove the  shampoo.                           4.  Use CHG as you would any other liquid soap.  You can apply chg directly  to the skin and wash                       Gently with  a scrungie or clean washcloth.  5.  Apply the CHG Soap to your body ONLY FROM THE NECK DOWN.   Do not use on face/ open                           Wound or open sores. Avoid contact with eyes, ears mouth and genitals (private parts).                       Wash face,  Genitals (private parts) with your normal soap.             6.  Wash thoroughly, paying special attention to the area where your surgery  will be performed.  7.  Thoroughly rinse your body with warm water from the neck down.  8.  DO NOT shower/wash with your normal soap after using and rinsing off  the CHG Soap.                9.  Pat yourself dry with a clean towel.            10.  Wear clean pajamas.            11.  Place clean sheets on  your bed the night of your first shower and do not  sleep with pets. Day of Surgery : Do not apply any lotions/deodorants the morning of surgery.  Please wear clean clothes to the hospital/surgery center.  FAILURE TO FOLLOW THESE INSTRUCTIONS MAY RESULT IN THE CANCELLATION OF YOUR SURGERY PATIENT SIGNATURE_________________________________  NURSE SIGNATURE__________________________________  ________________________________________________________________________

## 2014-01-23 NOTE — Pre-Procedure Instructions (Signed)
01-23-14 EKG/ CXR done today.

## 2014-01-25 NOTE — Progress Notes (Signed)
Quick Note:  These results are acceptable for scheduled surgery.  Jilliann Subramanian M. Ellwyn Ergle, MD, [facility] Office: [phone]   ______ 

## 2014-01-25 NOTE — Progress Notes (Signed)
Quick Note:  EKG is acceptable for scheduled surgery.  Eleuterio Dollar M. Quenesha Douglass, MD, FACS [facility] Office: [phone]   ______ 

## 2014-01-25 NOTE — Progress Notes (Signed)
Quick Note:  Pre-operative chest x-ray is acceptable for scheduled surgery.  Zac Torti M. Maryelizabeth Eberle, MD, [facility] Office: [phone]   ______ 

## 2014-01-29 ENCOUNTER — Encounter (HOSPITAL_COMMUNITY): Payer: BC Managed Care – PPO | Admitting: Certified Registered Nurse Anesthetist

## 2014-01-29 ENCOUNTER — Inpatient Hospital Stay (HOSPITAL_COMMUNITY)
Admission: RE | Admit: 2014-01-29 | Discharge: 2014-02-03 | DRG: 330 | Disposition: A | Discharge status: Home / Self Care | Payer: BC Managed Care – PPO | Source: Ambulatory Visit | Attending: Surgery | Admitting: Surgery

## 2014-01-29 ENCOUNTER — Encounter (HOSPITAL_COMMUNITY)
Admission: RE | Disposition: A | Discharge status: Home / Self Care | Payer: Self-pay | Source: Ambulatory Visit | Attending: Surgery

## 2014-01-29 ENCOUNTER — Encounter (HOSPITAL_COMMUNITY): Payer: Self-pay

## 2014-01-29 ENCOUNTER — Inpatient Hospital Stay (HOSPITAL_COMMUNITY): Payer: BC Managed Care – PPO | Admitting: Certified Registered Nurse Anesthetist

## 2014-01-29 DIAGNOSIS — K560 Paralytic ileus: Secondary | ICD-10-CM | POA: Diagnosis not present

## 2014-01-29 DIAGNOSIS — D378 Neoplasm of uncertain behavior of other specified digestive organs: Secondary | ICD-10-CM

## 2014-01-29 DIAGNOSIS — D371 Neoplasm of uncertain behavior of stomach: Secondary | ICD-10-CM

## 2014-01-29 DIAGNOSIS — D62 Acute posthemorrhagic anemia: Secondary | ICD-10-CM | POA: Diagnosis not present

## 2014-01-29 DIAGNOSIS — I10 Essential (primary) hypertension: Secondary | ICD-10-CM | POA: Diagnosis present

## 2014-01-29 DIAGNOSIS — D375 Neoplasm of uncertain behavior of rectum: Secondary | ICD-10-CM

## 2014-01-29 DIAGNOSIS — K6389 Other specified diseases of intestine: Secondary | ICD-10-CM | POA: Diagnosis present

## 2014-01-29 DIAGNOSIS — D374 Neoplasm of uncertain behavior of colon: Secondary | ICD-10-CM | POA: Diagnosis present

## 2014-01-29 DIAGNOSIS — D126 Benign neoplasm of colon, unspecified: Principal | ICD-10-CM | POA: Diagnosis present

## 2014-01-29 HISTORY — PX: PARTIAL COLECTOMY: SHX5273

## 2014-01-29 LAB — CREATININE, SERUM
CREATININE: 0.8 mg/dL (ref 0.50–1.10)
GFR calc Af Amer: >90 mL/min (ref >90)
GFR calc non Af Amer: 84 mL/min — ABNORMAL LOW (ref >90)

## 2014-01-29 LAB — CBC
HCT: 37.6 % (ref 36.0–46.0)
Hemoglobin: 12.4 g/dL (ref 12.0–15.0)
MCH: 28.1 pg (ref 26.0–34.0)
MCHC: 33 g/dL (ref 30.0–36.0)
MCV: 85.3 fL (ref 78.0–100.0)
PLATELETS: 343 10*3/uL (ref 150–400)
RBC: 4.41 MIL/uL (ref 3.87–5.11)
RDW: 15.2 % (ref 11.5–15.5)
WBC: 21.7 10*3/uL — ABNORMAL HIGH (ref 4.0–10.5)

## 2014-01-29 LAB — TYPE AND SCREEN
ABO/RH(D): B POS
Antibody Screen: NEGATIVE

## 2014-01-29 SURGERY — COLECTOMY, PARTIAL
Anesthesia: General | Site: Abdomen | Laterality: Right

## 2014-01-29 MED ORDER — LIDOCAINE HCL (CARDIAC) 20 MG/ML IV SOLN
INTRAVENOUS | Status: AC
  Filled 2014-01-29: qty 5

## 2014-01-29 MED ORDER — HYDROMORPHONE HCL PF 1 MG/ML IJ SOLN
INTRAMUSCULAR | Status: AC
  Filled 2014-01-29: qty 1

## 2014-01-29 MED ORDER — ENOXAPARIN SODIUM 40 MG/0.4ML ~~LOC~~ SOLN
40.0000 mg | SUBCUTANEOUS | Status: DC
  Administered 2014-01-30 – 2014-02-02 (×4): 40 mg via SUBCUTANEOUS
  Filled 2014-01-29 (×5): qty 0.4

## 2014-01-29 MED ORDER — DIPHENHYDRAMINE HCL 12.5 MG/5ML PO ELIX: 12.5000 mg | ORAL_SOLUTION | Freq: Four times a day (QID) | ORAL | Status: DC | PRN

## 2014-01-29 MED ORDER — KCL IN DEXTROSE-NACL 30-5-0.45 MEQ/L-%-% IV SOLN
INTRAVENOUS | Status: DC
  Administered 2014-01-29 – 2014-02-02 (×6): via INTRAVENOUS
  Filled 2014-01-29 (×11): qty 1000

## 2014-01-29 MED ORDER — ONDANSETRON HCL 4 MG/2ML IJ SOLN
INTRAMUSCULAR | Status: AC
  Filled 2014-01-29: qty 2

## 2014-01-29 MED ORDER — ROCURONIUM BROMIDE 100 MG/10ML IV SOLN
INTRAVENOUS | Status: AC
  Filled 2014-01-29: qty 1

## 2014-01-29 MED ORDER — METRONIDAZOLE IN NACL 5-0.79 MG/ML-% IV SOLN
500.0000 mg | INTRAVENOUS | Status: AC
  Administered 2014-01-29: 500 mg via INTRAVENOUS
  Filled 2014-01-29: qty 100

## 2014-01-29 MED ORDER — FENTANYL CITRATE 0.05 MG/ML IJ SOLN
INTRAMUSCULAR | Status: DC | PRN
  Administered 2014-01-29: 100 ug via INTRAVENOUS
  Administered 2014-01-29: 50 ug via INTRAVENOUS
  Administered 2014-01-29: 100 ug via INTRAVENOUS

## 2014-01-29 MED ORDER — MIDAZOLAM HCL 5 MG/5ML IJ SOLN
INTRAMUSCULAR | Status: DC | PRN
  Administered 2014-01-29: 2 mg via INTRAVENOUS

## 2014-01-29 MED ORDER — ONDANSETRON HCL 4 MG/2ML IJ SOLN
INTRAMUSCULAR | Status: DC | PRN
  Administered 2014-01-29: 4 mg via INTRAVENOUS

## 2014-01-29 MED ORDER — HYDROMORPHONE HCL PF 2 MG/ML IJ SOLN
INTRAMUSCULAR | Status: AC
  Filled 2014-01-29: qty 1

## 2014-01-29 MED ORDER — CHLORHEXIDINE GLUCONATE 0.12 % MT SOLN
15.0000 mL | Freq: Two times a day (BID) | OROMUCOSAL | Status: DC
  Administered 2014-01-29 – 2014-02-03 (×4): 15 mL via OROMUCOSAL
  Filled 2014-01-29 (×13): qty 15

## 2014-01-29 MED ORDER — ONDANSETRON HCL 4 MG/2ML IJ SOLN: 4.0000 mg | Freq: Four times a day (QID) | INTRAMUSCULAR | Status: DC | PRN

## 2014-01-29 MED ORDER — LIDOCAINE HCL (CARDIAC) 20 MG/ML IV SOLN
INTRAVENOUS | Status: DC | PRN
  Administered 2014-01-29 (×2): 50 mg via INTRAVENOUS

## 2014-01-29 MED ORDER — NEOSTIGMINE METHYLSULFATE 10 MG/10ML IV SOLN
INTRAVENOUS | Status: DC | PRN
  Administered 2014-01-29: 4 mg via INTRAVENOUS

## 2014-01-29 MED ORDER — ALVIMOPAN 12 MG PO CAPS
12.0000 mg | ORAL_CAPSULE | Freq: Two times a day (BID) | ORAL | Status: DC
  Administered 2014-01-30 – 2014-02-01 (×5): 12 mg via ORAL
  Filled 2014-01-29 (×9): qty 1

## 2014-01-29 MED ORDER — CETYLPYRIDINIUM CHLORIDE 0.05 % MT LIQD
7.0000 mL | Freq: Two times a day (BID) | OROMUCOSAL | Status: DC
  Administered 2014-01-29 – 2014-02-02 (×2): 7 mL via OROMUCOSAL

## 2014-01-29 MED ORDER — GLYCOPYRROLATE 0.2 MG/ML IJ SOLN
INTRAMUSCULAR | Status: AC
  Filled 2014-01-29: qty 3

## 2014-01-29 MED ORDER — MEPERIDINE HCL 50 MG/ML IJ SOLN: 6.2500 mg | INTRAMUSCULAR | Status: DC | PRN

## 2014-01-29 MED ORDER — SODIUM CHLORIDE 0.9 % IJ SOLN: 9.0000 mL | INTRAMUSCULAR | Status: DC | PRN

## 2014-01-29 MED ORDER — FENTANYL CITRATE 0.05 MG/ML IJ SOLN
INTRAMUSCULAR | Status: AC
  Filled 2014-01-29: qty 5

## 2014-01-29 MED ORDER — HYDROMORPHONE 0.3 MG/ML IV SOLN
INTRAVENOUS | Status: DC
  Administered 2014-01-29: 10:00:00 via INTRAVENOUS
  Administered 2014-01-29: 0.3 mg via INTRAVENOUS
  Administered 2014-01-29 – 2014-01-30 (×2): 0.6 mg via INTRAVENOUS
  Administered 2014-01-30 (×2): 0.3 mg via INTRAVENOUS
  Administered 2014-01-31: 0.6 mg via INTRAVENOUS
  Administered 2014-01-31: 3 mg via INTRAVENOUS
  Administered 2014-01-31: 0.9 mL via INTRAVENOUS
  Administered 2014-01-31: 0.9 mg via INTRAVENOUS
  Administered 2014-01-31: 0.3 mg via INTRAVENOUS
  Administered 2014-01-31: 0.36 mg via INTRAVENOUS
  Administered 2014-02-01: 0.6 mL via INTRAVENOUS
  Administered 2014-02-01 (×2): 0.3 mg via INTRAVENOUS
  Administered 2014-02-01: 0.6 mg via INTRAVENOUS
  Administered 2014-02-01: 04:00:00 via INTRAVENOUS
  Administered 2014-02-01: 0.6 mL via INTRAVENOUS
  Administered 2014-02-02: 0.3 mg via INTRAVENOUS
  Administered 2014-02-02: 0.6 mg via INTRAVENOUS
  Administered 2014-02-02: 0.9 mg via INTRAVENOUS
  Filled 2014-01-29: qty 25

## 2014-01-29 MED ORDER — MIDAZOLAM HCL 2 MG/2ML IJ SOLN
INTRAMUSCULAR | Status: AC
  Filled 2014-01-29: qty 2

## 2014-01-29 MED ORDER — HYDROMORPHONE 0.3 MG/ML IV SOLN
INTRAVENOUS | Status: AC
  Filled 2014-01-29: qty 25

## 2014-01-29 MED ORDER — GLYCOPYRROLATE 0.2 MG/ML IJ SOLN
INTRAMUSCULAR | Status: DC | PRN
  Administered 2014-01-29: 0.6 mg via INTRAVENOUS

## 2014-01-29 MED ORDER — NEOSTIGMINE METHYLSULFATE 10 MG/10ML IV SOLN
INTRAVENOUS | Status: AC
  Filled 2014-01-29: qty 1

## 2014-01-29 MED ORDER — PROMETHAZINE HCL 25 MG/ML IJ SOLN: 6.2500 mg | INTRAMUSCULAR | Status: DC | PRN

## 2014-01-29 MED ORDER — PROPOFOL 10 MG/ML IV BOLUS
INTRAVENOUS | Status: DC | PRN
  Administered 2014-01-29: 180 mg via INTRAVENOUS

## 2014-01-29 MED ORDER — NALOXONE HCL 0.4 MG/ML IJ SOLN: 0.4000 mg | INTRAMUSCULAR | Status: DC | PRN

## 2014-01-29 MED ORDER — PROPOFOL 10 MG/ML IV BOLUS
INTRAVENOUS | Status: AC
  Filled 2014-01-29: qty 20

## 2014-01-29 MED ORDER — KETOROLAC TROMETHAMINE 30 MG/ML IJ SOLN
INTRAMUSCULAR | Status: AC
  Filled 2014-01-29: qty 1

## 2014-01-29 MED ORDER — KETOROLAC TROMETHAMINE 30 MG/ML IJ SOLN
INTRAMUSCULAR | Status: DC | PRN
  Administered 2014-01-29: 30 mg via INTRAVENOUS

## 2014-01-29 MED ORDER — ACETAMINOPHEN 10 MG/ML IV SOLN
1000.0000 mg | Freq: Once | INTRAVENOUS | Status: AC
  Administered 2014-01-29: 1000 mg via INTRAVENOUS
  Filled 2014-01-29: qty 100

## 2014-01-29 MED ORDER — OXYCODONE HCL 5 MG PO TABS: 5.0000 mg | ORAL_TABLET | Freq: Once | ORAL | Status: DC | PRN

## 2014-01-29 MED ORDER — CIPROFLOXACIN IN D5W 400 MG/200ML IV SOLN
400.0000 mg | INTRAVENOUS | Status: AC
  Administered 2014-01-29: 400 mg via INTRAVENOUS

## 2014-01-29 MED ORDER — HYDROMORPHONE HCL PF 1 MG/ML IJ SOLN
INTRAMUSCULAR | Status: DC | PRN
  Administered 2014-01-29: 1 mg via INTRAVENOUS
  Administered 2014-01-29 (×2): 0.5 mg via INTRAVENOUS

## 2014-01-29 MED ORDER — LACTATED RINGERS IV SOLN: INTRAVENOUS | Status: DC

## 2014-01-29 MED ORDER — LACTATED RINGERS IV SOLN
INTRAVENOUS | Status: DC | PRN
  Administered 2014-01-29 (×3): via INTRAVENOUS

## 2014-01-29 MED ORDER — HYDROMORPHONE HCL PF 1 MG/ML IJ SOLN
0.2500 mg | INTRAMUSCULAR | Status: DC | PRN
  Administered 2014-01-29 (×4): 0.5 mg via INTRAVENOUS

## 2014-01-29 MED ORDER — DEXAMETHASONE SODIUM PHOSPHATE 10 MG/ML IJ SOLN
INTRAMUSCULAR | Status: DC | PRN
  Administered 2014-01-29: 10 mg via INTRAVENOUS

## 2014-01-29 MED ORDER — CIPROFLOXACIN IN D5W 400 MG/200ML IV SOLN
INTRAVENOUS | Status: AC
  Filled 2014-01-29: qty 200

## 2014-01-29 MED ORDER — ALVIMOPAN 12 MG PO CAPS
12.0000 mg | ORAL_CAPSULE | Freq: Once | ORAL | Status: AC
  Administered 2014-01-29: 12 mg via ORAL
  Filled 2014-01-29: qty 1

## 2014-01-29 MED ORDER — METRONIDAZOLE IN NACL 5-0.79 MG/ML-% IV SOLN
INTRAVENOUS | Status: AC
  Filled 2014-01-29: qty 100

## 2014-01-29 MED ORDER — SUCCINYLCHOLINE CHLORIDE 20 MG/ML IJ SOLN
INTRAMUSCULAR | Status: DC | PRN
  Administered 2014-01-29: 100 mg via INTRAVENOUS

## 2014-01-29 MED ORDER — DIPHENHYDRAMINE HCL 50 MG/ML IJ SOLN: 12.5000 mg | Freq: Four times a day (QID) | INTRAMUSCULAR | Status: DC | PRN

## 2014-01-29 MED ORDER — ROCURONIUM BROMIDE 100 MG/10ML IV SOLN
INTRAVENOUS | Status: DC | PRN
  Administered 2014-01-29: 35 mg via INTRAVENOUS
  Administered 2014-01-29: 5 mg via INTRAVENOUS

## 2014-01-29 MED ORDER — OXYCODONE HCL 5 MG/5ML PO SOLN: 5.0000 mg | Freq: Once | ORAL | Status: DC | PRN

## 2014-01-29 MED ORDER — DEXAMETHASONE SODIUM PHOSPHATE 10 MG/ML IJ SOLN
INTRAMUSCULAR | Status: AC
  Filled 2014-01-29: qty 1

## 2014-01-29 SURGICAL SUPPLY — 51 items
APPLICATOR COTTON TIP 6IN STRL (MISCELLANEOUS) ×6 IMPLANT
BLADE EXTENDED COATED 6.5IN (ELECTRODE) ×2 IMPLANT
BLADE HEX COATED 2.75 (ELECTRODE) ×6 IMPLANT
BLADE SURG SZ10 CARB STEEL (BLADE) ×2 IMPLANT
CHLORAPREP W/TINT 26ML (MISCELLANEOUS) ×3 IMPLANT
COUNTER NEEDLE 20 DBL MAG RED (NEEDLE) ×3 IMPLANT
COVER MAYO STAND STRL (DRAPES) ×6 IMPLANT
DRAPE LAPAROSCOPIC ABDOMINAL (DRAPES) ×3 IMPLANT
DRAPE LG THREE QUARTER DISP (DRAPES) ×3 IMPLANT
DRAPE UTILITY XL STRL (DRAPES) ×6 IMPLANT
DRAPE WARM FLUID 44X44 (DRAPE) ×3 IMPLANT
DRSG OPSITE POSTOP 3X4 (GAUZE/BANDAGES/DRESSINGS) IMPLANT
DRSG OPSITE POSTOP 4X10 (GAUZE/BANDAGES/DRESSINGS) IMPLANT
DRSG OPSITE POSTOP 4X6 (GAUZE/BANDAGES/DRESSINGS) ×2 IMPLANT
DRSG OPSITE POSTOP 4X8 (GAUZE/BANDAGES/DRESSINGS) IMPLANT
DRSG PAD ABDOMINAL 8X10 ST (GAUZE/BANDAGES/DRESSINGS) IMPLANT
ELECT REM PT RETURN 9FT ADLT (ELECTROSURGICAL) ×3
ELECTRODE REM PT RTRN 9FT ADLT (ELECTROSURGICAL) ×1 IMPLANT
GAUZE SPONGE 4X4 12PLY STRL (GAUZE/BANDAGES/DRESSINGS) IMPLANT
GLOVE SURG ORTHO 8.0 STRL STRW (GLOVE) ×6 IMPLANT
GOWN STRL REUS W/TWL XL LVL3 (GOWN DISPOSABLE) ×20 IMPLANT
KIT BASIN OR (CUSTOM PROCEDURE TRAY) ×3 IMPLANT
LEGGING LITHOTOMY PAIR STRL (DRAPES) ×1 IMPLANT
LIGASURE IMPACT 36 18CM CVD LR (INSTRUMENTS) ×2 IMPLANT
LUBRICANT JELLY K Y 4OZ (MISCELLANEOUS) IMPLANT
PACK GENERAL/GYN (CUSTOM PROCEDURE TRAY) ×3 IMPLANT
PENCIL BUTTON HOLSTER BLD 10FT (ELECTRODE) ×3 IMPLANT
RELOAD PROXIMATE 75MM BLUE (ENDOMECHANICALS) ×9 IMPLANT
RELOAD STAPLE 75 3.8 BLU REG (ENDOMECHANICALS) IMPLANT
STAPLER GUN LINEAR PROX 60 (STAPLE) ×2 IMPLANT
STAPLER PROXIMATE 75MM BLUE (STAPLE) ×2 IMPLANT
STAPLER VISISTAT 35W (STAPLE) ×3 IMPLANT
SUCTION POOLE TIP (SUCTIONS) ×3 IMPLANT
SUT NOV 1 T60/GS (SUTURE) ×6 IMPLANT
SUT NOVA NAB DX-16 0-1 5-0 T12 (SUTURE) IMPLANT
SUT NOVA T20/GS 25 (SUTURE) IMPLANT
SUT PDS AB 1 TP1 96 (SUTURE) IMPLANT
SUT SILK 2 0 (SUTURE) ×9
SUT SILK 2 0 SH CR/8 (SUTURE) ×5 IMPLANT
SUT SILK 2 0SH CR/8 30 (SUTURE) IMPLANT
SUT SILK 2-0 18XBRD TIE 12 (SUTURE) ×1 IMPLANT
SUT SILK 2-0 30XBRD TIE 12 (SUTURE) ×1 IMPLANT
SUT SILK 3 0 (SUTURE) ×3
SUT SILK 3 0 SH CR/8 (SUTURE) ×5 IMPLANT
SUT SILK 3-0 18XBRD TIE 12 (SUTURE) ×1 IMPLANT
TOWEL OR 17X26 10 PK STRL BLUE (TOWEL DISPOSABLE) ×6 IMPLANT
TOWEL OR NON WOVEN STRL DISP B (DISPOSABLE) ×6 IMPLANT
TRAY FOLEY CATH 14FRSI W/METER (CATHETERS) ×3 IMPLANT
TUBING CONNECTING 10 (TUBING) IMPLANT
TUBING CONNECTING 10' (TUBING)
YANKAUER SUCT BULB TIP 10FT TU (MISCELLANEOUS) ×3 IMPLANT

## 2014-01-29 NOTE — Op Note (Signed)
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  PROCEDURE:  Right colectomy.  SURGEON:  Earnstine Regal, MD, FACS  ANESTHESIA:  General.  ESTIMATED BLOOD LOSS:  Minimal.  PREPARATION:  ChloraPrep.  COMPLICATIONS:  None.  INDICATIONS:  The patient is a 53 year old female referred by her gastroenterologist for resection of tubulovillous adenoma at the hepatic flexure of the colon.  The patient had a screening colonoscopy.  She had had a history of borderline anemia.  Colonoscopy on January 02, 2014 showed an 8 cm mass at the hepatic flexure of the colon.  Biopsies confirmed tubulovillous adenoma without atypia or evidence of malignancy.  The patient now comes to Surgery for resection.  BODY OF REPORT:  Procedure was done in OR #1 at the [facility].  The patient was brought to the operating room, placed in supine position on the operating room table.  Following administration of general anesthesia, the patient was positioned and then prepped and draped in usual aseptic fashion.  After ascertaining that an adequate level of anesthesia had been achieved, a midline abdominal incision was made with a #10 blade.  Dissection was carried through subcutaneous tissues.  Fascia was incised in the midline and the peritoneal cavity was entered cautiously.  Abdomen was explored.  Liver was normal.  Gallbladder was normal.  Peritoneum was normal.  Omentum was normal.  Right colon was mobilized using the electrocautery to incise the colic gutter.  The cecum was mobile.  Terminal  ileum was somewhat adherent in the right lower quadrant and was freed from its retroperitoneal attachments.  The hepatic flexure was taken down using the LigaSure.  This allowed for complete mobilization of the right colon up and into the wound.  Careful palpation along the right colon revealed a sessile mass in the distal ascending colon at the hepatic flexure.  This was marked with a suture.  A point in the proximal transverse colon approximately 15 cm distal to the mass was selected and transected with a GIA stapler. Mesentery was divided with the LigaSure.  Right colic vessels were divided between Kelly clamps and ligated with 2-0 silk ties.  Dissection was carried down to the terminal ileum which was transected with the GIA stapler.  Remaining mesentery was divided with the LigaSure.  Specimen was passed off the field and submitted fresh to Pathology.  Dr. Tresa Moore confirmed that the mass measures approximately 5 cm in diameter.  It was 15 cm from one margin and 18 cm from the other margin.  There was no obvious signs of malignancy.  Next, a side-to-side functionally end-to-end anastomosis was created between the terminal ileum and the proximal transverse colon.  This was performed in the usual fashion with a GIA stapler.  The  enterotomy was closed with a TA 60 stapler.  Good hemostasis was achieved along staple lines with 3-0 silk sutures.  At this point, gowns and gloves and drapes were changed as per protocol.  Next, the mesenteric defect was closed with interrupted 2-0 silk sutures.  Abdomen was then irrigated with warm saline which was evacuated.  Good hemostasis was noted throughout.  Omentum was used to cover the anastomosis.  Midline incision was closed with interrupted #1 Novafil simple sutures.  Subcutaneous tissues were irrigated.  Skin was closed with stainless steel staples.  Sterile dressings were applied. The patient was awakened from anesthesia and brought to the  recovery room.  The patient tolerated the procedure well.   Earnstine Regal, MD, [facility] Office: [phone]    TMG/MEDQ  D:  01/29/2014  T:  01/29/2014  Job:  [id number]  cc:   [place]ford Cage. Laurann Montana, M.D. Fax: [place] Rolla Flatten., M.D. Fax: [phone]

## 2014-01-29 NOTE — Transfer of Care (Signed)
Immediate Anesthesia Transfer of Care Note  Patient: Amber Davidson  Procedure(s) Performed: Procedure(s): RIGHT COLECTOMY (Right)  Patient Location: PACU  Anesthesia Type:General  Level of Consciousness: awake, alert  and oriented  Airway & Oxygen Therapy: Patient Spontanous Breathing and Patient connected to face mask oxygen  Post-op Assessment: Report given to PACU RN and Post -op Vital signs reviewed and stable  Post vital signs: Reviewed and stable  Complications: No apparent anesthesia complications

## 2014-01-29 NOTE — H&P (View-Only) (Signed)
General Surgery [facility]  Chief Complaint  Patient presents with  . 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  Patient was sent for screening colonoscopy do to age. She has had no symptoms. She has had borderline anemia in the past. She has noted no bleeding. She has had no change in bowel habits.  Patient underwent colonoscopy on 01/02/2014. She was found to have an 8 cm mass at the hepatic flexure of the colon. Biopsies were taken showing a tubulovillous adenoma without atypia or evidence of malignancy. A CT scan of the abdomen and pelvis showed no worrisome findings. Patient is now referred for surgical resection for management and definitive diagnosis.  Past Medical History  Diagnosis Date  . Heart murmur   . Hypertension     Current Outpatient Prescriptions  Medication Sig Dispense Refill  . benazepril-hydrochlorthiazide (LOTENSIN HCT) 10-12.5 MG per tablet        No current facility-administered medications for this visit.    Allergies  Allergen Reactions  . Penicillins     Family History  Problem Relation Age of Onset  . Cancer Mother     breast    History   Social History  . Marital Status: Married    Spouse Name: N/A    Number of Children: N/A  . Years of Education: N/A   Social History Main Topics  . Smoking status: Never Smoker   . Smokeless tobacco: None  . Alcohol Use: Yes  . Drug Use: No  . Sexual Activity: None   Other Topics Concern  . None   Social History Narrative  . None    REVIEW OF SYSTEMS - PERTINENT POSITIVES ONLY: Denies change in bowel habits. Denies bleeding per rectum. Denies abdominal pain.  EXAMDanley Danker Vitals:   01/14/14 1207  BP: 126/76  Pulse: 70  Temp: 97.4  F (36.3 C)    GENERAL: well-developed, well-nourished, no acute distress HEENT: normocephalic; pupils equal and reactive; sclerae clear; dentition good; mucous membranes moist NECK:  No palpable masses in the thyroid bed; well-healed surgical wound right submandibular region; symmetric on extension; no palpable anterior or posterior cervical lymphadenopathy; no supraclavicular masses; no tenderness CHEST: clear to auscultation bilaterally without rales, rhonchi, or wheezes CARDIAC: regular rate and rhythm without significant murmur; peripheral pulses are full ABDOMEN: soft without distension; bowel sounds present; no mass; no hepatosplenomegaly; no hernia EXT:  non-tender without edema; no deformity NEURO: no gross focal deficits; no sign of tremor   LABORATORY RESULTS: See [facility] (CHL-Epic) for most recent results  RADIOLOGY RESULTS: See [facility] (CHL-Epic) for most recent results  IMPRESSION: 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 I have recommended right colectomy. We discussed the risk and benefits of the procedure. We discussed other options for management. We discussed potential complications including wound infection. We discussed the hospital stay to be anticipated and the postoperative recovery and return to work and activities. Patient understands and wishes to proceed with surgery in the near future. We will make arrangements at a time convenient for the patient.  The risks and benefits of the procedure have been discussed at length with the patient.  The patient understands  the proposed procedure, potential alternative treatments, and the course of recovery to be expected.  All of the patient's questions have been answered at this time.  The patient wishes to proceed with surgery.  Earnstine Regal, MD, [facility]  Primary  Care Physician: Osborne Casco, MD

## 2014-01-29 NOTE — Interval H&P Note (Signed)
History and Physical Interval Note:  01/29/2014 7:18 AM  Margueritte A Ost  has presented today for surgery, with the diagnosis of tubulovillous adenoma at hepatic flexure.  The various methods of treatment have been discussed with the patient and family. After consideration of risks, benefits and other options for treatment, the patient has consented to    Procedure(s): RIGHT COLECTOMY (Right) as a surgical intervention .    The patient's history has been reviewed, patient examined, no change in status, stable for surgery.  I have reviewed the patient's chart and labs.  Questions were answered to the patient's satisfaction.    Earnstine Regal, MD, [facility] Office: [place]

## 2014-01-29 NOTE — Anesthesia Postprocedure Evaluation (Signed)
Anesthesia Post Note  Patient: Amber Davidson  Procedure(s) Performed: Procedure(s) (LRB): RIGHT COLECTOMY (Right)  Anesthesia type: General  Patient location: PACU  Post pain: Pain level controlled  Post assessment: Post-op Vital signs reviewed  Last Vitals: BP 162/88  Pulse 54  Temp(Src) 36.4 C (Oral)  Resp 12  Ht 5\' 6"  (1.676 m)  Wt 174 lb 8 oz (79.153 kg)  BMI 28.18 kg/m2  SpO2 100%  Post vital signs: Reviewed  Level of consciousness: sedated  Complications: No apparent anesthesia complications

## 2014-01-29 NOTE — Anesthesia Preprocedure Evaluation (Signed)
Anesthesia Evaluation  Patient identified by MRN, date of birth, ID band Patient awake    Reviewed: Allergy & Precautions, H&P , NPO status , Patient's Chart, lab work & pertinent test results  History of Anesthesia Complications (+) PONV, PROLONGED EMERGENCE and history of anesthetic complications  Airway Mallampati: II TM Distance: >3 FB Neck ROM: Full    Dental no notable dental hx.    Pulmonary neg pulmonary ROS,  breath sounds clear to auscultation  Pulmonary exam normal       Cardiovascular hypertension, negative cardio ROS  + Valvular Problems/Murmurs Rhythm:Regular Rate:Normal     Neuro/Psych negative neurological ROS  negative psych ROS   GI/Hepatic negative GI ROS, Neg liver ROS,   Endo/Other  negative endocrine ROS  Renal/GU negative Renal ROS  negative genitourinary   Musculoskeletal negative musculoskeletal ROS (+)   Abdominal   Peds  Hematology negative hematology ROS (+)   Anesthesia Other Findings   Reproductive/Obstetrics negative OB ROS                           Anesthesia Physical Anesthesia Plan  ASA: II  Anesthesia Plan: General   Post-op Pain Management:    Induction: Intravenous  Airway Management Planned: Oral ETT  Additional Equipment:   Intra-op Plan:   Post-operative Plan: Extubation in OR  Informed Consent: I have reviewed the patients History and Physical, chart, labs and discussed the procedure including the risks, benefits and alternatives for the proposed anesthesia with the patient or authorized representative who has indicated his/her understanding and acceptance.   Dental advisory given  Plan Discussed with: CRNA  Anesthesia Plan Comments:         Anesthesia Quick Evaluation

## 2014-01-29 NOTE — Brief Op Note (Signed)
01/29/2014  9:17 AM  PATIENT:  Amber Davidson  53 y.o. female  PRE-OPERATIVE DIAGNOSIS:  tubulovillous adenoma at hepatic flexure  POST-OPERATIVE DIAGNOSIS:  tubulovillous adenoma at hepatic flexure  PROCEDURE:  Procedure(s): RIGHT COLECTOMY (Right)  SURGEON:  Surgeon(s) and Role:    * Earnstine Regal, MD - Primary  ANESTHESIA:   general  EBL:  Total I/O In: 2000 [I.V.:2000] Out: 150 [Urine:100; Blood:50]  BLOOD ADMINISTERED:none  DRAINS: none   LOCAL MEDICATIONS USED:  NONE  SPECIMEN:  Excision  DISPOSITION OF SPECIMEN:  PATHOLOGY  COUNTS:  YES  TOURNIQUET:  * No tourniquets in log *  DICTATION: .Other Dictation: Dictation Number [id number]  PLAN OF CARE: Admit to inpatient   PATIENT DISPOSITION:  PACU - hemodynamically stable.   Delay start of Pharmacological VTE agent (>24hrs) due to surgical blood loss or risk of bleeding: yes  Earnstine Regal, MD, [facility] Office: [phone]

## 2014-01-30 ENCOUNTER — Encounter (HOSPITAL_COMMUNITY): Payer: Self-pay | Admitting: Surgery

## 2014-01-30 LAB — BASIC METABOLIC PANEL
ANION GAP: 9 (ref 5–15)
BUN: 5 mg/dL — AB (ref 6–23)
CO2: 26 mEq/L (ref 19–32)
CREATININE: 0.78 mg/dL (ref 0.50–1.10)
Calcium: 9.5 mg/dL (ref 8.4–10.5)
Chloride: 104 mEq/L (ref 96–112)
GFR calc Af Amer: >90 mL/min (ref >90)
Glucose, Bld: 134 mg/dL — ABNORMAL HIGH (ref 70–99)
Potassium: 4.2 mEq/L (ref 3.7–5.3)
Sodium: 139 mEq/L (ref 137–147)

## 2014-01-30 LAB — CBC
HEMATOCRIT: 36.1 % (ref 36.0–46.0)
Hemoglobin: 12 g/dL (ref 12.0–15.0)
MCH: 28 pg (ref 26.0–34.0)
MCHC: 33.2 g/dL (ref 30.0–36.0)
MCV: 84.1 fL (ref 78.0–100.0)
Platelets: 319 10*3/uL (ref 150–400)
RBC: 4.29 MIL/uL (ref 3.87–5.11)
RDW: 15.3 % (ref 11.5–15.5)
WBC: 20.6 10*3/uL — AB (ref 4.0–10.5)

## 2014-01-30 MED ORDER — HYDROCODONE-ACETAMINOPHEN 5-325 MG PO TABS: 1.0000 | ORAL_TABLET | ORAL | Status: DC | PRN

## 2014-01-30 MED ORDER — LABETALOL HCL 5 MG/ML IV SOLN
20.0000 mg | Freq: Once | INTRAVENOUS | Status: AC
  Administered 2014-01-30: 20 mg via INTRAVENOUS
  Filled 2014-01-30: qty 4

## 2014-01-30 NOTE — Progress Notes (Signed)
Patient ID: Amber Davidson, female   DOB: 02-06-1961, 53 y.o.   MRN: [id number]  General Surgery - [facility] - Progress Note  POD# 1  Subjective: Patient up in chair, already ambulated this AM.  Mild nausea yesterday, now resolved.  Pain controlled with PCA.  Objective: Vital signs in last 24 hours: Temp:  [97.1 F (36.2 C)-98.6 F (37 C)] 98.4 F (36.9 C) (08/07 1000) Pulse Rate:  [55-75] 64 (08/07 1000) Resp:  [10-17] 17 (08/07 1000) BP: (132-185)/(80-111) 168/111 mmHg (08/07 1000) SpO2:  [98 %-100 %] 99 % (08/07 1000) Last BM Date: 01/28/14  Intake/Output from previous day: 08/06 0701 - 08/07 0700 In: 4046 [P.O.:1; I.V.:4045] Out: 1940 [Urine:1890; Blood:50]  Exam: HEENT - clear, not icteric Neck - soft Chest - clear bilaterally Cor - RRR, no murmur Abd - wound dry, dressing intact; no BS on auscultation Ext - no significant edema Neuro - grossly intact, no focal deficits  Lab Results:   Recent Labs  01/29/14 1130 01/30/14 0453  WBC 21.7* 20.6*  HGB 12.4 12.0  HCT 37.6 36.1  PLT 343 319     Recent Labs  01/29/14 1130 01/30/14 0453  NA  --  139  K  --  4.2  CL  --  104  CO2  --  26  GLUCOSE  --  134*  BUN  --  5*  CREATININE 0.80 0.78  CALCIUM  --  9.5    Studies/Results: No results found.  Assessment / Plan: 1.  Status post right colectomy for T-V adenoma  Begin clear liquid diet  On Entereg  Ambulate, IS use (ordered)  PCA dilaudid for pain control  Earnstine Regal, MD, [facility] Office: [phone]  01/30/2014

## 2014-01-30 NOTE — Care Management Note (Signed)
    Page 1 of 1   01/30/2014     10:12:53 AM CARE MANAGEMENT NOTE 01/30/2014  Patient:  Amber Davidson, Amber Davidson   Account Number:  [id number]  Date Initiated:  01/30/2014  Documentation initiated by:  Sunday Spillers  Subjective/Objective Assessment:   53 yo female admitted s/p colectomy. PTA lived at home with daughter.     Action/Plan:   Home when stable   Anticipated DC Date:  02/02/2014   Anticipated DC Plan:  [place]  CM consult      Choice offered to / List presented to:             Status of service:  Completed, signed off Medicare Important Message given?   (If response is "NO", the following Medicare IM given date fields will be blank) Date Medicare IM given:   Medicare IM given by:   Date Additional Medicare IM given:   Additional Medicare IM given by:    Discharge Disposition:  HOME/SELF CARE  Per UR Regulation:  Reviewed for med. necessity/level of care/duration of stay  If discussed at [place] of Stay Meetings, dates discussed:    Comments:

## 2014-01-31 DIAGNOSIS — I10 Essential (primary) hypertension: Secondary | ICD-10-CM

## 2014-01-31 LAB — CBC
HCT: 32.7 % — ABNORMAL LOW (ref 36.0–46.0)
HEMOGLOBIN: 11.4 g/dL — AB (ref 12.0–15.0)
MCH: 29.2 pg (ref 26.0–34.0)
MCHC: 34.9 g/dL (ref 30.0–36.0)
MCV: 83.8 fL (ref 78.0–100.0)
Platelets: 283 10*3/uL (ref 150–400)
RBC: 3.9 MIL/uL (ref 3.87–5.11)
RDW: 15.8 % — ABNORMAL HIGH (ref 11.5–15.5)
WBC: 13.2 10*3/uL — ABNORMAL HIGH (ref 4.0–10.5)

## 2014-01-31 LAB — BASIC METABOLIC PANEL
Anion gap: 10 (ref 5–15)
BUN: 4 mg/dL — ABNORMAL LOW (ref 6–23)
CALCIUM: 8.9 mg/dL (ref 8.4–10.5)
CO2: 26 mEq/L (ref 19–32)
Chloride: 101 mEq/L (ref 96–112)
Creatinine, Ser: 0.74 mg/dL (ref 0.50–1.10)
GLUCOSE: 105 mg/dL — AB (ref 70–99)
Potassium: 3.7 mEq/L (ref 3.7–5.3)
Sodium: 137 mEq/L (ref 137–147)

## 2014-01-31 MED ORDER — LABETALOL HCL 5 MG/ML IV SOLN
20.0000 mg | INTRAVENOUS | Status: DC | PRN
  Administered 2014-01-31 (×3): 20 mg via INTRAVENOUS
  Filled 2014-01-31 (×4): qty 4

## 2014-01-31 MED ORDER — BENAZEPRIL HCL 5 MG PO TABS
5.0000 mg | ORAL_TABLET | Freq: Every day | ORAL | Status: DC
  Administered 2014-01-31 – 2014-02-02 (×3): 5 mg via ORAL
  Filled 2014-01-31 (×4): qty 1

## 2014-01-31 MED ORDER — HYDROCHLOROTHIAZIDE 10 MG/ML ORAL SUSPENSION
6.2500 mg | Freq: Every day | ORAL | Status: DC
  Administered 2014-01-31 – 2014-02-02 (×3): 6.25 mg via ORAL
  Filled 2014-01-31 (×5): qty 1.25

## 2014-01-31 MED ORDER — BENAZEPRIL-HYDROCHLOROTHIAZIDE 10-12.5 MG PO TABS: 0.5000 | ORAL_TABLET | Freq: Every morning | ORAL | Status: DC

## 2014-01-31 NOTE — Progress Notes (Signed)
2 Days Post-Op  Subjective: Not flatus or BM.  No nausea.  Tolerating clear liquids.  Objective: Vital signs in last 24 hours: Temp:  [98.2 F (36.8 C)-99 F (37.2 C)] 98.6 F (37 C) (08/08 0515) Pulse Rate:  [61-71] 63 (08/08 0515) Resp:  [12-20] 14 (08/08 0515) BP: (150-170)/(90-111) 167/105 mmHg (08/08 0515) SpO2:  [98 %-100 %] 99 % (08/08 0515) FiO2 (%):  [34 %-43 %] 43 % (08/08 0418) Last BM Date: 01/28/14  Intake/Output from previous day: 08/07 0701 - 08/08 0700 In: 2125.4 [P.O.:390; I.V.:1735.4] Out: 2525 [Urine:2525] Intake/Output this shift:    PE: General- In NAD Abdomen-soft, incision clean and intact, active bowel sounds  Lab Results:   Recent Labs  01/30/14 0453 01/31/14 0516  WBC 20.6* 13.2*  HGB 12.0 11.4*  HCT 36.1 32.7*  PLT 319 283   BMET  Recent Labs  01/30/14 0453 01/31/14 0516  NA 139 137  K 4.2 3.7  CL 104 101  CO2 26 26  GLUCOSE 134* 105*  BUN 5* 4*  CREATININE 0.78 0.74  CALCIUM 9.5 8.9   PT/INR No results found for this basename: LABPROT, INR,  in the last 72 hours Comprehensive Metabolic Panel:    Component Value Date/Time   NA 137 01/31/2014 0516   NA 139 01/30/2014 0453   K 3.7 01/31/2014 0516   K 4.2 01/30/2014 0453   CL 101 01/31/2014 0516   CL 104 01/30/2014 0453   CO2 26 01/31/2014 0516   CO2 26 01/30/2014 0453   BUN 4* 01/31/2014 0516   BUN 5* 01/30/2014 0453   CREATININE 0.74 01/31/2014 0516   CREATININE 0.78 01/30/2014 0453   GLUCOSE 105* 01/31/2014 0516   GLUCOSE 134* 01/30/2014 0453   CALCIUM 8.9 01/31/2014 0516   CALCIUM 9.5 01/30/2014 0453     Studies/Results: No results found.  Anti-infectives: Anti-infectives   Start     Dose/Rate Route Frequency Ordered Stop   01/29/14 0537  metroNIDAZOLE (FLAGYL) IVPB 500 mg     500 mg 100 mL/hr over 60 Minutes Intravenous On call to O.R. 01/29/14 6834 01/29/14 0809   01/29/14 0537  ciprofloxacin (CIPRO) IVPB 400 mg     400 mg 200 mL/hr over 60 Minutes Intravenous On call to O.R.  01/29/14 1962 01/29/14 2297      Assessment 1. Status post right colectomy for T-V adenoma-tolerating liquids; comfortable on PCA 2.  HTN-BP has been elevated. 3.  ABL anemia-mild.    LOS: 2 days   Plan: Full liquids.  Restart Lisinopril-HCTZ.  Labetolol prn.   Riggin Cuttino J 01/31/2014

## 2014-02-01 MED ORDER — CLONIDINE HCL 0.1 MG/24HR TD PTWK
0.1000 mg | MEDICATED_PATCH | TRANSDERMAL | Status: DC
  Administered 2014-02-01: 0.1 mg via TRANSDERMAL
  Filled 2014-02-01 (×2): qty 1

## 2014-02-01 NOTE — Progress Notes (Signed)
3 Days Post-Op  Subjective: No flatus or BM.  No nausea or vomiting.  Walking.  Objective: Vital signs in last 24 hours: Temp:  [97.9 F (36.6 C)-98.1 F (36.7 C)] 98.1 F (36.7 C) (08/09 0500) Pulse Rate:  [68-78] 78 (08/09 0500) Resp:  [16] 16 (08/09 0500) BP: (140-174)/(90-111) 155/90 mmHg (08/09 0500) SpO2:  [97 %-100 %] 100 % (08/09 0740) Last BM Date: 01/28/14  Intake/Output from previous day: 08/08 0701 - 08/09 0700 In: 2760 [P.O.:960; I.V.:1800] Out: 4300 [Urine:4300] Intake/Output this shift:    PE: General- In NAD Abdomen-soft, incision clean and intact, fewer bowel sounds today  Lab Results:   Recent Labs  01/30/14 0453 01/31/14 0516  WBC 20.6* 13.2*  HGB 12.0 11.4*  HCT 36.1 32.7*  PLT 319 283   BMET  Recent Labs  01/30/14 0453 01/31/14 0516  NA 139 137  K 4.2 3.7  CL 104 101  CO2 26 26  GLUCOSE 134* 105*  BUN 5* 4*  CREATININE 0.78 0.74  CALCIUM 9.5 8.9   PT/INR No results found for this basename: LABPROT, INR,  in the last 72 hours Comprehensive Metabolic Panel:    Component Value Date/Time   NA 137 01/31/2014 0516   NA 139 01/30/2014 0453   K 3.7 01/31/2014 0516   K 4.2 01/30/2014 0453   CL 101 01/31/2014 0516   CL 104 01/30/2014 0453   CO2 26 01/31/2014 0516   CO2 26 01/30/2014 0453   BUN 4* 01/31/2014 0516   BUN 5* 01/30/2014 0453   CREATININE 0.74 01/31/2014 0516   CREATININE 0.78 01/30/2014 0453   GLUCOSE 105* 01/31/2014 0516   GLUCOSE 134* 01/30/2014 0453   CALCIUM 8.9 01/31/2014 0516   CALCIUM 9.5 01/30/2014 0453     Studies/Results: No results found.  Anti-infectives: Anti-infectives   Start     Dose/Rate Route Frequency Ordered Stop   01/29/14 0537  metroNIDAZOLE (FLAGYL) IVPB 500 mg     500 mg 100 mL/hr over 60 Minutes Intravenous On call to O.R. 01/29/14 8756 01/29/14 0809   01/29/14 0537  ciprofloxacin (CIPRO) IVPB 400 mg     400 mg 200 mL/hr over 60 Minutes Intravenous On call to O.R. 01/29/14 4332 01/29/14 9518       Assessment 1. Status post right colectomy for T-V adenoma-some postop ileus; on full liquids. 2.  HTN-BP still elevated.  On home anti-hypertensive and prn labetolol 3.  ABL anemia-mild.    LOS: 3 days   Plan: Keep on full liquids.  Add Catapres patch.   Amber Davidson J 02/01/2014

## 2014-02-02 MED ORDER — OXYCODONE HCL 5 MG PO TABS
5.0000 mg | ORAL_TABLET | ORAL | Status: DC | PRN
  Administered 2014-02-02 (×3): 5 mg via ORAL
  Filled 2014-02-02: qty 2
  Filled 2014-02-02 (×2): qty 1

## 2014-02-02 NOTE — Progress Notes (Signed)
4 Days Post-Op  Subjective: Bowels moving.  Tolerating full liquids.  Objective: Vital signs in last 24 hours: Temp:  [98 F (36.7 C)-98.2 F (36.8 C)] 98.2 F (36.8 C) (08/10 0550) Pulse Rate:  [66-82] 82 (08/10 0550) Resp:  [14-18] 18 (08/10 0550) BP: (122-152)/(84-102) 122/84 mmHg (08/10 0550) SpO2:  [94 %-100 %] 100 % (08/10 0550) Last BM Date: 02/01/14  Intake/Output from previous day: 08/09 0701 - 08/10 0700 In: 1982.5 [P.O.:240; I.V.:1742.5] Out: 2300 [Urine:2300] Intake/Output this shift:    PE: General- In NAD Abdomen-soft, incision clean and intact Lab Results:  Colon, segmental resection for tumor, Right ascending - TUBULOVILLOUS ADENOMA WITH FOCAL HIGH GRADE DYSPLASIA. - SUBMUCOSAL LIPOMATOUS PROLIFERATION. 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 - TWELVE BENIGN LYMPH NODES (0/12   Recent Labs  01/31/14 0516  WBC 13.2*  HGB 11.4*  HCT 32.7*  PLT 283   BMET  Recent Labs  01/31/14 0516  NA 137  K 3.7  CL 101  CO2 26  GLUCOSE 105*  BUN 4*  CREATININE 0.74  CALCIUM 8.9   PT/INR No results found for this basename: LABPROT, INR,  in the last 72 hours Comprehensive Metabolic Panel:    Component Value Date/Time   NA 137 01/31/2014 0516   NA 139 01/30/2014 0453   K 3.7 01/31/2014 0516   K 4.2 01/30/2014 0453   CL 101 01/31/2014 0516   CL 104 01/30/2014 0453   CO2 26 01/31/2014 0516   CO2 26 01/30/2014 0453   BUN 4* 01/31/2014 0516   BUN 5* 01/30/2014 0453   CREATININE 0.74 01/31/2014 0516   CREATININE 0.78 01/30/2014 0453   GLUCOSE 105* 01/31/2014 0516   GLUCOSE 134* 01/30/2014 0453   CALCIUM 8.9 01/31/2014 0516   CALCIUM 9.5 01/30/2014 0453     Studies/Results: No results found.  Anti-infectives: Anti-infectives   Start     Dose/Rate Route Frequency Ordered Stop   01/29/14 0537  metroNIDAZOLE (FLAGYL) IVPB 500 mg     500 mg 100 mL/hr over 60 Minutes Intravenous On call to O.R. 01/29/14 9983 01/29/14 0809   01/29/14 0537  ciprofloxacin  (CIPRO) IVPB 400 mg     400 mg 200 mL/hr over 60 Minutes Intravenous On call to O.R. 01/29/14 3825 01/29/14 0539      Assessment 1. Status post right colectomy for T-V adenoma-ileus resolving; above pathology discussed with her 2.  HTN-BP improved on current regimen 3.  ABL anemia-mild.    LOS: 4 days   Plan: Advance diet.  D/C PCA and start oral analgesic.  Heplock IV.   Amil Moseman J 02/02/2014

## 2014-02-02 NOTE — Discharge Instructions (Signed)
[facility]  OPEN ABDOMINAL SURGERY: POST OP INSTRUCTIONS  Always review your discharge instruction sheet given to you by the facility where your surgery was performed.  1. A prescription for pain medication may be given to you upon discharge.  Take your pain medication as prescribed.  If narcotic pain medicine is not needed, then you may take acetaminophen (Tylenol) or ibuprofen (Advil) as needed. 2. Take your usually prescribed medications unless otherwise directed. 3. If you need a refill on your pain medication, please contact your pharmacy. They will contact our office to request authorization.  Prescriptions will not be filled after 5 pm or on weekends. 4. You should follow a lowfat diet after arrival home.  Be sure to include plenty of fluids daily.  5. Most patients will experience some swelling and bruising in the area of the incision. Ice packs will help. Swelling and bruising can take several days to resolve. 6. It is common to experience some constipation if taking pain medication after surgery.  Increasing fluid intake and taking a stool softener will usually help or prevent this problem from occurring.  A mild laxative (Milk of Magnesia or Miralax) should be taken according to package directions if there are no bowel movements after 48 hours. 7.  You may have steri-strips (small skin tapes) in place directly over the incision.  These strips should be left on the skin for 7-10 days.  If your surgeon used skin glue on the incision, you may shower in 24 hours.  The glue will flake off over the next 2-3 weeks.  Any sutures or staples will be removed at the office during your follow-up visit. You may find that a light gauze bandage over your incision may keep your staples from being rubbed or pulled. You may shower and replace the bandage daily. 8. ACTIVITIES:  You may resume regular (light) daily activities beginning the next day--such as daily self-care, walking, climbing  stairs--gradually increasing activities as tolerated.  You may have sexual intercourse when it is comfortable.  Refrain from any heavy lifting or straining until approved by your doctor.  You may drive when you no longer are taking prescription pain medication, you can comfortably wear a seatbelt, and you can safely maneuver your car and apply brakes. 9. You should see your doctor in the office for a follow-up appointment approximately two weeks after your surgery.  Make sure that you call for this appointment within a day or two after you arrive home to insure a convenient appointment time.  WHEN TO CALL YOUR DOCTOR: 1. Fever greater than 101.0 2. Inability to urinate 3. Persistent nausea and/or vomiting 4. Extreme swelling or bruising 5. Continued bleeding from incision 6. Increased pain, redness, or drainage from the incision  IF YOU HAVE DISABILITY OR FAMILY LEAVE FORMS, YOU MUST BRING THEM TO THE OFFICE FOR PROCESSING.  PLEASE DO NOT GIVE THEM TO YOUR DOCTOR.  The clinic staff is available to answer your questions during regular business hours.  Please dont hesitate to call and ask to speak to one of the nurses if you have concerns.  [facility] [place] Office: [phone]  For further questions, please visit [email or web address]

## 2014-02-02 NOTE — Progress Notes (Signed)
Pharmacy Brief Note - Alvimopan (Entereg)  The standing order set for alvimopan (Entereg) now includes an automatic order to discontinue the drug after the patient has had a bowel movement.  The change was approved by the [place] and the Medical Executive Committee.    This patient has had a bowel movement documented by nursing.  Therefore, alvimopan has been discontinued.  If there are questions, please contact the pharmacy at [phone].  Thank you-  Dolly Rias RPh 02/02/2014, 10:33 AM Pager [phone]

## 2014-02-03 ENCOUNTER — Telehealth (INDEPENDENT_AMBULATORY_CARE_PROVIDER_SITE_OTHER): Payer: Self-pay

## 2014-02-03 MED ORDER — OXYCODONE HCL 5 MG PO TABS: 5.0000 mg | ORAL_TABLET | ORAL | Status: AC | PRN

## 2014-02-03 NOTE — Discharge Summary (Signed)
Physician Discharge Summary  Patient ID: Amber Davidson MRN: [id number] DOB/AGE: 51-Dec-1962 89 y.o.  Admit date: 01/29/2014 Discharge date: 02/03/2014  Admission Diagnoses:  Large tubullovillous adenoma of ascending colon  Discharge Diagnoses:  Same HTN    Discharged Condition: good  Hospital Course: She underwent a right colectomy on 8/6.  Pathology demonstrated tubullovillous adenoma with high grade dysplasis but no malignancy.  She had some problems with HTN which was able to be controlled with medications. Other than that, she had an unremarkable postop course.  Her bowel function returned on POD #4 and she was able to be discharged on POD #5.  Discharge instructions were given to her.  Consults: None  Significant Diagnostic Studies: none  Treatments: surgery: right colectomy  Discharge Exam: Blood pressure 119/83, pulse 85, temperature 99 F (37.2 C), temperature source Oral, resp. rate 16, height 5\' 6"  (1.676 m), weight 174 lb 8 oz (79.153 kg), SpO2 100.00%.   Disposition:   Discharge to home.  Return to office on 8/14 for staple removal.     Medication List    STOP taking these medications       erythromycin 500 MG EC tablet  Commonly known as:  ERY-TAB     neomycin 500 MG tablet  Commonly known as:  MYCIFRADIN      TAKE these medications       HYDROcodone-acetaminophen 5-325 MG per tablet  Commonly known as:  NORCO/VICODIN  Take 1-2 tablets by mouth every 4 (four) hours as needed for moderate pain.     oxyCODONE 5 MG immediate release tablet  Commonly known as:  Oxy IR/ROXICODONE  Take 1-2 tablets (5-10 mg total) by mouth every 4 (four) hours as needed for moderate pain.      ASK your doctor about these medications       benazepril-hydrochlorthiazide 10-12.5 MG per tablet  Commonly known as:  LOTENSIN HCT  Take 0.5 tablets by mouth every morning.           Follow-up Information   Schedule an appointment as soon as possible for a visit with  Earnstine Regal, MD. (For wound re-check)    Specialty:  General Surgery   Contact information:   [address] [place] [address] [phone]       Signed: Odis Hollingshead 02/03/2014, 8:51 AM

## 2014-02-03 NOTE — Progress Notes (Signed)
5 Days Post-Op  Subjective: Tolerated diet.  Oral pain medication worked well.  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 - SUBMUCOSAL LIPOMATOUS PROLIFERATION. 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 - TWELVE BENIGN LYMPH NODES (0/12  No results found for this basename: WBC, HGB, HCT, PLT,  in the last 72 hours BMET No results found for this basename: NA, K, CL, CO2, GLUCOSE, BUN, CREATININE, CALCIUM,  in the last 72 hours PT/INR No results found for this basename: LABPROT, INR,  in the last 72 hours Comprehensive Metabolic Panel:    Component Value Date/Time   NA 137 01/31/2014 0516   NA 139 01/30/2014 0453   K 3.7 01/31/2014 0516   K 4.2 01/30/2014 0453   CL 101 01/31/2014 0516   CL 104 01/30/2014 0453   CO2 26 01/31/2014 0516   CO2 26 01/30/2014 0453   BUN 4* 01/31/2014 0516   BUN 5* 01/30/2014 0453   CREATININE 0.74 01/31/2014 0516   CREATININE 0.78 01/30/2014 0453   GLUCOSE 105* 01/31/2014 0516   GLUCOSE 134* 01/30/2014 0453   CALCIUM 8.9 01/31/2014 0516   CALCIUM 9.5 01/30/2014 0453     Studies/Results: No results found.  Anti-infectives: Anti-infectives   Start     Dose/Rate Route Frequency Ordered Stop   01/29/14 0537  metroNIDAZOLE (FLAGYL) IVPB 500 mg     500 mg 100 mL/hr over 60 Minutes Intravenous On call to O.R. 01/29/14 6295 01/29/14 0809   01/29/14 0537  ciprofloxacin (CIPRO) IVPB 400 mg     400 mg 200 mL/hr over 60 Minutes Intravenous On call to O.R. 01/29/14 2841 01/29/14 3244      Assessment 1. Status post right colectomy for T-V adenoma-doing well 2.  HTN-BP improved on current regimen 3.  ABL anemia-mild.    LOS: 5 days   Plan: Discharge.  Return to office 8/14 for staple removal.  Discharge instructions given to her.   Kenise Barraco J 02/03/2014

## 2014-02-03 NOTE — Telephone Encounter (Signed)
LMOV pt's appt for staple removal 02/06/14 at 10:00 a.m.  Post op 02/17/14 at 9:30 with Dr. Harlow Asa.

## 2014-02-06 ENCOUNTER — Ambulatory Visit (INDEPENDENT_AMBULATORY_CARE_PROVIDER_SITE_OTHER): Payer: BC Managed Care – PPO

## 2014-02-06 DIAGNOSIS — K6389 Other specified diseases of intestine: Secondary | ICD-10-CM

## 2014-02-06 DIAGNOSIS — K639 Disease of intestine, unspecified: Secondary | ICD-10-CM

## 2014-02-06 NOTE — Progress Notes (Signed)
Pt came in for nurse only to have staples removed per Dr Zella Richer. Pt is a s/p right colectomy done by Dr Harlow Asa on 01/29/2014. Removed all staples from the midline incision and steri strips applied. I covered the area with 4x4 gauze and tape just to get the pt home in case if the area should drain any on the way home with the staples being removed. The incision looked really good and clean. Pt has a f/u appt with Dr Harlow Asa on 02/17/2014. Pt tolerated the staple removal well.

## 2014-02-17 ENCOUNTER — Ambulatory Visit (INDEPENDENT_AMBULATORY_CARE_PROVIDER_SITE_OTHER): Payer: BC Managed Care – PPO | Admitting: Surgery

## 2014-02-17 ENCOUNTER — Encounter (INDEPENDENT_AMBULATORY_CARE_PROVIDER_SITE_OTHER): Payer: Self-pay | Admitting: Surgery

## 2014-02-17 ENCOUNTER — Encounter (INDEPENDENT_AMBULATORY_CARE_PROVIDER_SITE_OTHER): Payer: Self-pay

## 2014-02-17 ENCOUNTER — Other Ambulatory Visit: Payer: Self-pay

## 2014-02-17 VITALS — BP 110/72 | HR 97 | Temp 98.2°F | Ht 66.0 in | Wt 167.4 lb

## 2014-02-17 DIAGNOSIS — D371 Neoplasm of uncertain behavior of stomach: Secondary | ICD-10-CM

## 2014-02-17 DIAGNOSIS — Z1231 Encounter for screening mammogram for malignant neoplasm of breast: Secondary | ICD-10-CM

## 2014-02-17 DIAGNOSIS — D374 Neoplasm of uncertain behavior of colon: Secondary | ICD-10-CM

## 2014-02-17 DIAGNOSIS — D375 Neoplasm of uncertain behavior of rectum: Secondary | ICD-10-CM

## 2014-02-17 DIAGNOSIS — K639 Disease of intestine, unspecified: Secondary | ICD-10-CM

## 2014-02-17 DIAGNOSIS — D378 Neoplasm of uncertain behavior of other specified digestive organs: Secondary | ICD-10-CM

## 2014-02-17 DIAGNOSIS — K6389 Other specified diseases of intestine: Secondary | ICD-10-CM

## 2014-02-17 NOTE — Patient Instructions (Signed)
  CARE OF INCISION   Apply cocoa butter/vitamin E cream (Palmer's brand) to your incision 2 - 3 times daily.  Massage cream into incision for one minute with each application.  Use sunscreen (50 SPF or higher) for first 6 months after surgery if area is exposed to sun.  You may alternate Mederma or other scar reducing cream with cocoa butter cream if desired.       Amber Davidson M. Amber Pinedo, MD, FACS      [facility]      Office: [phone]    

## 2014-02-17 NOTE — Progress Notes (Signed)
General Surgery [facility]  Chief Complaint  Patient presents with  . Routine Post Op    right colectomy 01/29/2014    HISTORY: Patient is a 53 year old female who underwent right colectomy for tubulovillous adenoma on 01/29/2014. Postoperative course has been uncomplicated.  Final pathology shows a tubulovillous adenoma with focal high-grade dysplasia. There was no invasive component. 12 lymph nodes were negative for malignancy.  EXAM: Incision is healing nicely. Staples and Steri-Strips have been removed. No sign of infection. No sign of herniation.  IMPRESSION: Status post right colectomy  PLAN: Patient is doing well. She would like to return to work on September 15.  Patient will return for final wound check in 6 weeks.  Earnstine Regal, MD, [facility]   Visit Diagnoses: 1. Mass of hepatic flexure of colon   2. 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

## 2014-02-20 ENCOUNTER — Ambulatory Visit
Admission: RE | Admit: 2014-02-20 | Discharge: 2014-02-20 | Disposition: A | Discharge status: Home / Self Care | Payer: BC Managed Care – PPO | Source: Ambulatory Visit

## 2014-02-20 DIAGNOSIS — Z1231 Encounter for screening mammogram for malignant neoplasm of breast: Secondary | ICD-10-CM

## 2014-02-23 ENCOUNTER — Encounter: Payer: Self-pay | Admitting: Gastroenterology

## 2014-03-31 ENCOUNTER — Encounter (INDEPENDENT_AMBULATORY_CARE_PROVIDER_SITE_OTHER): Payer: BC Managed Care – PPO | Admitting: Surgery

## 2014-12-16 ENCOUNTER — Other Ambulatory Visit: Payer: Self-pay | Admitting: Gastroenterology

## 2015-05-18 ENCOUNTER — Other Ambulatory Visit (HOSPITAL_COMMUNITY): Payer: Self-pay | Admitting: *Deleted

## 2015-05-19 ENCOUNTER — Encounter (HOSPITAL_COMMUNITY)
Admission: RE | Admit: 2015-05-19 | Discharge: 2015-05-19 | Disposition: A | Discharge status: Home / Self Care | Payer: BC Managed Care – PPO | Source: Ambulatory Visit | Attending: Gastroenterology | Admitting: Gastroenterology

## 2015-05-19 DIAGNOSIS — D509 Iron deficiency anemia, unspecified: Secondary | ICD-10-CM | POA: Diagnosis present

## 2015-05-19 MED ORDER — SODIUM CHLORIDE 0.9 % IV SOLN
510.0000 mg | INTRAVENOUS | Status: DC
  Administered 2015-05-19: 510 mg via INTRAVENOUS
  Filled 2015-05-19: qty 17

## 2015-05-25 ENCOUNTER — Other Ambulatory Visit (HOSPITAL_COMMUNITY): Payer: Self-pay | Admitting: *Deleted

## 2015-05-26 ENCOUNTER — Ambulatory Visit (HOSPITAL_COMMUNITY)
Admission: RE | Admit: 2015-05-26 | Discharge: 2015-05-26 | Disposition: A | Discharge status: Home / Self Care | Payer: BC Managed Care – PPO | Source: Ambulatory Visit | Attending: Gastroenterology | Admitting: Gastroenterology

## 2015-05-26 DIAGNOSIS — D509 Iron deficiency anemia, unspecified: Secondary | ICD-10-CM | POA: Insufficient documentation

## 2015-05-26 MED ORDER — SODIUM CHLORIDE 0.9 % IV SOLN
510.0000 mg | INTRAVENOUS | Status: DC
  Administered 2015-05-26: 510 mg via INTRAVENOUS
  Filled 2015-05-26: qty 17

## 2015-06-02 ENCOUNTER — Other Ambulatory Visit: Payer: Self-pay | Admitting: Gastroenterology

## 2015-06-02 DIAGNOSIS — D649 Anemia, unspecified: Secondary | ICD-10-CM

## 2015-06-02 DIAGNOSIS — K921 Melena: Secondary | ICD-10-CM

## 2015-06-11 ENCOUNTER — Other Ambulatory Visit: Payer: BC Managed Care – PPO

## 2015-06-14 ENCOUNTER — Other Ambulatory Visit: Payer: BC Managed Care – PPO

## 2015-06-14 ENCOUNTER — Ambulatory Visit
Admission: RE | Admit: 2015-06-14 | Discharge: 2015-06-14 | Disposition: A | Discharge status: Home / Self Care | Payer: BC Managed Care – PPO | Source: Ambulatory Visit | Attending: Gastroenterology | Admitting: Gastroenterology

## 2015-06-14 DIAGNOSIS — K921 Melena: Secondary | ICD-10-CM

## 2015-06-14 DIAGNOSIS — D649 Anemia, unspecified: Secondary | ICD-10-CM

## 2015-06-14 MED ORDER — IOPAMIDOL (ISOVUE-300) INJECTION 61%
100.0000 mL | Freq: Once | INTRAVENOUS | Status: AC | PRN
  Administered 2015-06-14: 100 mL via INTRAVENOUS

## 2015-06-30 ENCOUNTER — Other Ambulatory Visit: Payer: Self-pay | Admitting: Gastroenterology

## 2015-08-17 ENCOUNTER — Encounter (HOSPITAL_COMMUNITY): Payer: Self-pay | Admitting: *Deleted

## 2015-08-18 ENCOUNTER — Other Ambulatory Visit: Payer: Self-pay | Admitting: Gastroenterology

## 2015-08-26 ENCOUNTER — Ambulatory Visit (HOSPITAL_COMMUNITY): Payer: BC Managed Care – PPO | Admitting: Certified Registered"

## 2015-08-26 ENCOUNTER — Encounter (HOSPITAL_COMMUNITY): Payer: Self-pay

## 2015-08-26 ENCOUNTER — Ambulatory Visit (HOSPITAL_COMMUNITY)
Admission: RE | Admit: 2015-08-26 | Discharge: 2015-08-26 | Disposition: A | Discharge status: Home / Self Care | Payer: BC Managed Care – PPO | Source: Ambulatory Visit | Attending: Gastroenterology | Admitting: Gastroenterology

## 2015-08-26 ENCOUNTER — Encounter (HOSPITAL_COMMUNITY)
Admission: RE | Disposition: A | Discharge status: Home / Self Care | Payer: Self-pay | Source: Ambulatory Visit | Attending: Gastroenterology

## 2015-08-26 DIAGNOSIS — Z980 Intestinal bypass and anastomosis status: Secondary | ICD-10-CM | POA: Diagnosis not present

## 2015-08-26 DIAGNOSIS — D509 Iron deficiency anemia, unspecified: Secondary | ICD-10-CM | POA: Insufficient documentation

## 2015-08-26 DIAGNOSIS — K633 Ulcer of intestine: Secondary | ICD-10-CM | POA: Diagnosis present

## 2015-08-26 DIAGNOSIS — Z9049 Acquired absence of other specified parts of digestive tract: Secondary | ICD-10-CM | POA: Insufficient documentation

## 2015-08-26 DIAGNOSIS — Z90710 Acquired absence of both cervix and uterus: Secondary | ICD-10-CM | POA: Insufficient documentation

## 2015-08-26 DIAGNOSIS — Z79899 Other long term (current) drug therapy: Secondary | ICD-10-CM | POA: Diagnosis not present

## 2015-08-26 DIAGNOSIS — I10 Essential (primary) hypertension: Secondary | ICD-10-CM | POA: Insufficient documentation

## 2015-08-26 DIAGNOSIS — K6389 Other specified diseases of intestine: Secondary | ICD-10-CM | POA: Insufficient documentation

## 2015-08-26 DIAGNOSIS — R195 Other fecal abnormalities: Secondary | ICD-10-CM | POA: Insufficient documentation

## 2015-08-26 HISTORY — DX: Anemia, unspecified: D64.9

## 2015-08-26 HISTORY — PX: COLONOSCOPY WITH PROPOFOL: SHX5780

## 2015-08-26 HISTORY — PX: HOT HEMOSTASIS: SHX5433

## 2015-08-26 SURGERY — COLONOSCOPY WITH PROPOFOL
Anesthesia: Monitor Anesthesia Care

## 2015-08-26 MED ORDER — LIDOCAINE HCL (CARDIAC) 20 MG/ML IV SOLN
INTRAVENOUS | Status: DC | PRN
Start: 2015-08-26 — End: 2015-08-26
  Administered 2015-08-26: 40 mg via INTRAVENOUS

## 2015-08-26 MED ORDER — PROPOFOL 10 MG/ML IV BOLUS
INTRAVENOUS | Status: AC
Start: 2015-08-26 — End: 2015-08-26
  Filled 2015-08-26: qty 40

## 2015-08-26 MED ORDER — ONDANSETRON HCL 4 MG/2ML IJ SOLN
INTRAMUSCULAR | Status: DC | PRN
  Administered 2015-08-26 (×2): 2 mg via INTRAVENOUS

## 2015-08-26 MED ORDER — ONDANSETRON HCL 4 MG/2ML IJ SOLN
INTRAMUSCULAR | Status: AC
  Filled 2015-08-26: qty 2

## 2015-08-26 MED ORDER — PROPOFOL 10 MG/ML IV BOLUS
INTRAVENOUS | Status: AC
  Filled 2015-08-26: qty 20

## 2015-08-26 MED ORDER — PROPOFOL 500 MG/50ML IV EMUL
INTRAVENOUS | Status: DC | PRN
  Administered 2015-08-26: 200 ug/kg/min via INTRAVENOUS

## 2015-08-26 MED ORDER — LIDOCAINE HCL (CARDIAC) 20 MG/ML IV SOLN
INTRAVENOUS | Status: AC
  Filled 2015-08-26: qty 5

## 2015-08-26 MED ORDER — LACTATED RINGERS IV SOLN
INTRAVENOUS | Status: DC | PRN
  Administered 2015-08-26: 10:00:00 via INTRAVENOUS

## 2015-08-26 MED ORDER — SODIUM CHLORIDE 0.9 % IV SOLN: INTRAVENOUS | Status: DC

## 2015-08-26 MED ORDER — PROPOFOL 10 MG/ML IV BOLUS
INTRAVENOUS | Status: DC | PRN
  Administered 2015-08-26: 60 mg via INTRAVENOUS

## 2015-08-26 SURGICAL SUPPLY — 22 items

## 2015-08-26 NOTE — Anesthesia Preprocedure Evaluation (Signed)
Anesthesia Evaluation  Patient identified by MRN, date of birth, ID band Patient awake    Reviewed: Allergy & Precautions, NPO status , Patient's Chart, lab work & pertinent test results  History of Anesthesia Complications (+) PONV and history of anesthetic complications  Airway Mallampati: II  TM Distance: >3 FB Neck ROM: Full    Dental  (+) Teeth Intact, Dental Advisory Given   Pulmonary neg pulmonary ROS,    Pulmonary exam normal breath sounds clear to auscultation       Cardiovascular Exercise Tolerance: Good hypertension, Pt. on medications (-) angina(-) Past MI Normal cardiovascular exam Rhythm:Regular Rate:Normal     Neuro/Psych negative neurological ROS  negative psych ROS   GI/Hepatic negative GI ROS, Neg liver ROS,   Endo/Other  negative endocrine ROS  Renal/GU negative Renal ROS     Musculoskeletal negative musculoskeletal ROS (+)   Abdominal   Peds  Hematology  (+) Blood dyscrasia, anemia ,   Anesthesia Other Findings Day of surgery medications reviewed with the patient.  Reproductive/Obstetrics negative OB ROS                             Anesthesia Physical Anesthesia Plan  ASA: II  Anesthesia Plan: MAC   Post-op Pain Management:    Induction: Intravenous  Airway Management Planned: Nasal Cannula  Additional Equipment:   Intra-op Plan:   Post-operative Plan:   Informed Consent: I have reviewed the patients History and Physical, chart, labs and discussed the procedure including the risks, benefits and alternatives for the proposed anesthesia with the patient or authorized representative who has indicated his/her understanding and acceptance.   Dental advisory given  Plan Discussed with: CRNA and Anesthesiologist  Anesthesia Plan Comments: (Discussed risks/benefits/alternatives to MAC sedation including need for ventilatory support, hypotension, need for  conversion to general anesthesia.  All patient questions answered.  Patient wished to proceed.)        Anesthesia Quick Evaluation

## 2015-08-26 NOTE — Anesthesia Postprocedure Evaluation (Signed)
Anesthesia Post Note  Patient: Mileidy A Hunnell  Procedure(s) Performed: Procedure(s) (LRB): COLONOSCOPY WITH PROPOFOL (N/A) HOT HEMOSTASIS (ARGON PLASMA COAGULATION/BICAP) (N/A)  Patient location during evaluation: PACU Anesthesia Type: MAC Level of consciousness: awake and alert, awake, oriented and patient cooperative Pain management: pain level controlled Vital Signs Assessment: post-procedure vital signs reviewed and stable Respiratory status: spontaneous breathing, nonlabored ventilation, respiratory function stable and patient connected to nasal cannula oxygen Cardiovascular status: blood pressure returned to baseline and stable Postop Assessment: no signs of nausea or vomiting Anesthetic complications: no    Last Vitals:  Filed Vitals:   08/26/15 1020 08/26/15 1030  BP: 128/102 140/94  Pulse: 78 65  Temp:    Resp: 22 15    Last Pain: There were no vitals filed for this visit.               Catalina Gravel

## 2015-08-26 NOTE — Transfer of Care (Signed)
Immediate Anesthesia Transfer of Care Note  Patient: Amber Davidson  Procedure(s) Performed: Procedure(s): COLONOSCOPY WITH PROPOFOL (N/A) HOT HEMOSTASIS (ARGON PLASMA COAGULATION/BICAP) (N/A)  Patient Location: PACU  Anesthesia Type:MAC  Level of Consciousness:  sedated, patient cooperative and responds to stimulation  Airway & Oxygen Therapy:Patient Spontanous Breathing and Patient connected to face mask oxgen  Post-op Assessment:  Report given to PACU RN and Post -op Vital signs reviewed and stable  Post vital signs:  Reviewed and stable  Last Vitals:  Filed Vitals:   08/26/15 0808  BP: 134/89  Pulse: 70  Temp: 36.6 C  Resp: 12    Complications: No apparent anesthesia complications

## 2015-08-26 NOTE — H&P (Signed)
Subjective:   Patient is a 55 y.o. female presents with Persistent anemia and him positive stools. She has had a large villous adenoma with high-grade dysplasia but no malignancy in the past requiring right hemicolectomy and postoperative colonoscopy on routine schedule revealed colonic ulcer with biopsies negative. She's continued to have in positive stools in the etiology of her continued ulcer is unclear. For this reason colonoscopy peak biopsy of the altar at the anastomosis is performed . Procedure including risks and benefits discussed in office.  Patient Active Problem List   Diagnosis Date Noted  . 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 Mass of hepatic flexure of colon 01/14/2014   Past Medical History  Diagnosis Date  . Heart murmur   . Hypertension   . Complication of anesthesia     slow  to awaken most times  . PONV (postoperative nausea and vomiting)   . Mass of colon     found with routine screening  . Anemia     Past Surgical History  Procedure Laterality Date  . Abdominal hysterectomy    . Lymph node biopsy      neck-some yrs ago  . Partial colectomy Right 01/29/2014    Procedure: RIGHT COLECTOMY;  Surgeon: Earnstine Regal, MD;  Location: [facility];  Service: General;  Laterality: Right;  . Colonscopy  last done dec 2016    x 3    Prescriptions prior to admission  Medication Sig Dispense Refill Last Dose  . benazepril-hydrochlorthiazide (LOTENSIN HCT) 10-12.5 MG per tablet Take 0.5 tablets by mouth every morning.    08/25/2015 at Unknown time  . POLY-IRON 150 150 MG capsule Take 1 tablet by mouth at bedtime.  12 Past Week at Unknown time  . oxyCODONE (OXY IR/ROXICODONE) 5 MG immediate release tablet Take 1-2 tablets (5-10 mg total) by mouth every 4 (four) hours as needed for moderate pain. (Patient not taking: Reported on 08/12/2015) 40 tablet 0    Allergies  Allergen Reactions  . Penicillins Anaphylaxis    Has patient had a PCN reaction  causing immediate rash, facial/tongue/throat swelling, SOB or lightheadedness with hypotension: Yes Has patient had a PCN reaction causing severe rash involving mucus membranes or skin necrosis: No Has patient had a PCN reaction that required hospitalization: Yes Has patient had a PCN reaction occurring within the last 10 years: No If all of the above answers are "NO", then may proceed with Cephalosporin use.     Social History  Substance Use Topics  . Smoking status: Never Smoker   . Smokeless tobacco: Never Used  . Alcohol Use: Yes     Comment: rare social    Family History  Problem Relation Age of Onset  . Cancer Mother     breast     Objective:   Patient Vitals for the past 8 hrs:  BP Temp Temp src Pulse Resp SpO2 Height Weight  08/26/15 0808 134/89 mmHg 97.9 F (36.6 C) Oral 70 12 98 % 5\' 6"  (1.676 m) 78.926 kg (174 lb)         See MD Preop evaluation H&P significant for normal heart and lung exam. ASA class 2     Assessment:   1. Stool positive for FOB. Patient has had hemicolectomy for large villous adenoma with high-grade dysplasia is a persistent colonic ulcer at the anastomosis biopsy benign  Plan:   We will proceed with colonoscopy reevaluate the ulcerated area at the anastomosis.

## 2015-08-26 NOTE — Op Note (Signed)
[facility] [place], [address]   COLONOSCOPY PROCEDURE REPORT  PATIENT: Amber, Davidson  MR#: [id number] BIRTHDATE: 12-08-1960 , 54  yrs. old GENDER: female ENDOSCOPIST: Laurence Spates, MD REFERRED BY:  Dr. Laurann Montana PROCEDURE DATE:  2015-09-17 PROCEDURE:   colonoscopy with fulgaration of inflammation with AP ASA CLASS:   Class 2 INDICATIONS:patient had right hemicolectomy for villous adenoma with high-grade dysplasia in 2015.    she has continued to have ulceration and inflammation at the anastomosis that has caused persistent heme positive stool and iron deficiency anemia MEDICATIONS: fentanyl 390 g IV  DESCRIPTION OF PROCEDURE:   After the risks and benefits and of the procedure were explained, informed consent was obtained.  digital exam was normal         The [id number])  endoscope was introduced through the anus and advanced to the terminal ileum .  The quality of the prep was excellent.      .  The instrument was then slowly withdrawn as the colon was fully examined. Estimated blood loss is zero unless otherwise noted in this procedure report. the terminal ileum was normal. At the anastomosis were areas of marked inflammation but not Frank ulceration This had the appearance of continued granulation tissue. Given the fact that this is been going on for 2 years and has required transfusions and persistent iron deficiency anemia the areas were fulgerated with theAPC. There was no bleeding following the procedure. No other gross lesions were sameupon removal of the scope.   The scope was then withdrawn from the patient and the procedure completed.  WITHDRAWAL TIME:  COMPLICATIONS: There were no immediate complications. ENDOSCOPIC IMPRESSION: 1. marked granulation tissue. This was fulgerated with the APC.  RECOMMENDATIONS: 1. will see the patient back in the office in 3 months. 2. Will repeat procedure in 3 years.  REPEAT  EXAM:  cc:Dr. Kelton Pillar, Dr. Armandina Gemma  _______________________________ eSigned:  Laurence Spates, MD September 17, 2015 10:30 AM   CPT CODES: ICD CODES:  The ICD and CPT codes recommended by this software are interpretations from the data that the clinical staff has captured with the software.  The verification of the translation of this report to the ICD and CPT codes and modifiers is the sole responsibility of the health care institution and practicing physician where this report was generated.  [employer] will not be held responsible for the validity of the ICD and CPT codes included on this report.  AMA assumes no liability for data contained or not contained herein. CPT is a Designer, television/film set of the [employer].   PATIENT NAME:  Amber, Davidson MR#: [id number]

## 2015-08-26 NOTE — Discharge Instructions (Addendum)
Colonoscopy, Care After °These instructions give you information on caring for yourself after your procedure. Your doctor may also give you more specific instructions. Call your doctor if you have any problems or questions after your procedure. °HOME CARE °· Do not drive for 24 hours. °· Do not sign important papers or use machinery for 24 hours. °· You may shower. °· You may go back to your usual activities, but go slower for the first 24 hours. °· Take rest breaks often during the first 24 hours. °· Walk around or use warm packs on your belly (abdomen) if you have belly cramping or gas. °· Drink enough fluids to keep your pee (urine) clear or pale yellow. °· Resume your normal diet. Avoid heavy or fried foods. °· Avoid drinking alcohol for 24 hours or as told by your doctor. °· Only take medicines as told by your doctor. °If a tissue sample (biopsy) was taken during the procedure:  °· Do not take aspirin or blood thinners for 7 days, or as told by your doctor. °· Do not drink alcohol for 7 days, or as told by your doctor. °· Eat soft foods for the first 24 hours. °GET HELP IF: °You still have a small amount of blood in your poop (stool) 2-3 days after the procedure. °GET HELP RIGHT AWAY IF: °· You have more than a small amount of blood in your poop. °· You see clumps of tissue (blood clots) in your poop. °· Your belly is puffy (swollen). °· You feel sick to your stomach (nauseous) or throw up (vomit). °· You have a fever. °· You have belly pain that gets worse and medicine does not help. °MAKE SURE YOU: °· Understand these instructions. °· Will watch your condition. °· Will get help right away if you are not doing well or get worse. °  °This information is not intended to replace advice given to you by your health care provider. Make sure you discuss any questions you have with your health care provider. °  °Document Released: 07/15/2010 Document Revised: 06/17/2013 Document Reviewed: 02/17/2013 °Elsevier  Interactive Patient Education ©2016 [employer] ° ° °Monitored Anesthesia Care °Monitored anesthesia care is an anesthesia service for a medical procedure. Anesthesia is the loss of the ability to feel pain. It is produced by medicines called anesthetics. It may affect a small area of your body (local anesthesia), a large area of your body (regional anesthesia), or your entire body (general anesthesia). The need for monitored anesthesia care depends your procedure, your condition, and the potential need for regional or general anesthesia. It is often provided during procedures where:  °· General anesthesia may be needed if there are complications. This is because you need special care when you are under general anesthesia.   °· You will be under local or regional anesthesia. This is so that you are able to have higher levels of anesthesia if needed.   °· You will receive calming medicines (sedatives). This is especially the case if sedatives are given to put you in a semi-conscious state of relaxation (deep sedation). This is because the amount of sedative needed to produce this state can be hard to predict. Too much of a sedative can produce general anesthesia. °Monitored anesthesia care is performed by one or more health care providers who have special training in all types of anesthesia. You will need to meet with these health care providers before your procedure. During this meeting, they will ask you about your medical history. They will   also give you instructions to follow. (For example, you will need to stop eating and drinking before your procedure. You may also need to stop or change medicines you are taking.) During your procedure, your health care providers will stay with you. They will:   Watch your condition. This includes watching your blood pressure, breathing, and level of pain.   Diagnose and treat problems that occur.   Give medicines if they are needed. These may include calming  medicines (sedatives) and anesthetics.   Make sure you are comfortable.  Having monitored anesthesia care does not necessarily mean that you will be under anesthesia. It does mean that your health care providers will be able to manage anesthesia if you need it or if it occurs. It also means that you will be able to have a different type of anesthesia than you are having if you need it. When your procedure is complete, your health care providers will continue to watch your condition. They will make sure any medicines wear off before you are allowed to go home.    This information is not intended to replace advice given to you by your health care provider. Make sure you discuss any questions you have with your health care provider.   Document Released: 03/08/2005 Document Revised: 07/03/2014 Document Reviewed: 07/24/2012 Elsevier Interactive Patient Education 2016 [employer].  No NSAIDs for 1 week

## 2015-08-30 ENCOUNTER — Encounter (HOSPITAL_COMMUNITY): Payer: Self-pay | Admitting: Gastroenterology

## 2017-08-20 ENCOUNTER — Other Ambulatory Visit: Payer: Self-pay | Admitting: Family Medicine

## 2017-08-20 DIAGNOSIS — Z1231 Encounter for screening mammogram for malignant neoplasm of breast: Secondary | ICD-10-CM

## 2017-09-10 ENCOUNTER — Ambulatory Visit
Admission: RE | Admit: 2017-09-10 | Discharge: 2017-09-10 | Disposition: A | Discharge status: Home / Self Care | Payer: BC Managed Care – PPO | Source: Ambulatory Visit | Attending: Family Medicine | Admitting: Family Medicine

## 2017-09-10 DIAGNOSIS — Z1231 Encounter for screening mammogram for malignant neoplasm of breast: Secondary | ICD-10-CM

## 2017-09-12 ENCOUNTER — Other Ambulatory Visit: Payer: Self-pay | Admitting: Family Medicine

## 2017-09-12 DIAGNOSIS — R928 Other abnormal and inconclusive findings on diagnostic imaging of breast: Secondary | ICD-10-CM

## 2017-09-18 ENCOUNTER — Ambulatory Visit: Payer: BC Managed Care – PPO

## 2017-09-18 ENCOUNTER — Ambulatory Visit
Admission: RE | Admit: 2017-09-18 | Discharge: 2017-09-18 | Disposition: A | Discharge status: Home / Self Care | Payer: BC Managed Care – PPO | Source: Ambulatory Visit | Attending: Family Medicine | Admitting: Family Medicine

## 2017-09-18 ENCOUNTER — Other Ambulatory Visit: Payer: Self-pay | Admitting: Family Medicine

## 2017-09-18 DIAGNOSIS — R928 Other abnormal and inconclusive findings on diagnostic imaging of breast: Secondary | ICD-10-CM

## 2017-09-18 DIAGNOSIS — R921 Mammographic calcification found on diagnostic imaging of breast: Secondary | ICD-10-CM

## 2017-09-24 HISTORY — PX: BREAST BIOPSY: SHX20

## 2017-09-27 ENCOUNTER — Ambulatory Visit
Admission: RE | Admit: 2017-09-27 | Discharge: 2017-09-27 | Disposition: A | Discharge status: Home / Self Care | Payer: BC Managed Care – PPO | Source: Ambulatory Visit | Attending: Family Medicine | Admitting: Family Medicine

## 2017-09-27 ENCOUNTER — Other Ambulatory Visit: Payer: Self-pay | Admitting: Family Medicine

## 2017-09-27 DIAGNOSIS — R921 Mammographic calcification found on diagnostic imaging of breast: Secondary | ICD-10-CM

## 2018-11-15 ENCOUNTER — Other Ambulatory Visit: Payer: Self-pay | Admitting: Family Medicine

## 2018-11-15 DIAGNOSIS — Z1231 Encounter for screening mammogram for malignant neoplasm of breast: Secondary | ICD-10-CM

## 2018-12-13 ENCOUNTER — Ambulatory Visit
Admission: RE | Admit: 2018-12-13 | Discharge: 2018-12-13 | Disposition: A | Discharge status: Home / Self Care | Payer: BC Managed Care – PPO | Source: Ambulatory Visit | Attending: Family Medicine | Admitting: Family Medicine

## 2018-12-13 ENCOUNTER — Other Ambulatory Visit: Payer: Self-pay

## 2018-12-13 DIAGNOSIS — Z1231 Encounter for screening mammogram for malignant neoplasm of breast: Secondary | ICD-10-CM

## 2019-04-28 ENCOUNTER — Other Ambulatory Visit: Payer: Self-pay | Admitting: Nurse Practitioner

## 2019-04-28 DIAGNOSIS — N6310 Unspecified lump in the right breast, unspecified quadrant: Secondary | ICD-10-CM

## 2019-04-30 ENCOUNTER — Other Ambulatory Visit: Payer: Self-pay | Admitting: Nurse Practitioner

## 2019-04-30 ENCOUNTER — Ambulatory Visit
Admission: RE | Admit: 2019-04-30 | Discharge: 2019-04-30 | Disposition: A | Discharge status: Home / Self Care | Payer: BC Managed Care – PPO | Source: Ambulatory Visit | Attending: Nurse Practitioner | Admitting: Nurse Practitioner

## 2019-04-30 ENCOUNTER — Other Ambulatory Visit: Payer: Self-pay

## 2019-04-30 DIAGNOSIS — N6310 Unspecified lump in the right breast, unspecified quadrant: Secondary | ICD-10-CM

## 2019-07-17 IMAGING — MG DIGITAL DIAGNOSTIC UNILATERAL LEFT MAMMOGRAM WITH TOMO AND CAD
6 series · 6 of 14 positions shown · non-contrast
Comparison: Previous exam(s).

CLINICAL DATA: Calcifications and possible mass in the upper outer
left breast on a recent 3D screening mammogram.

EXAM:
DIGITAL DIAGNOSTIC UNILATERAL LEFT MAMMOGRAM WITH CAD AND TOMO

[L ML]
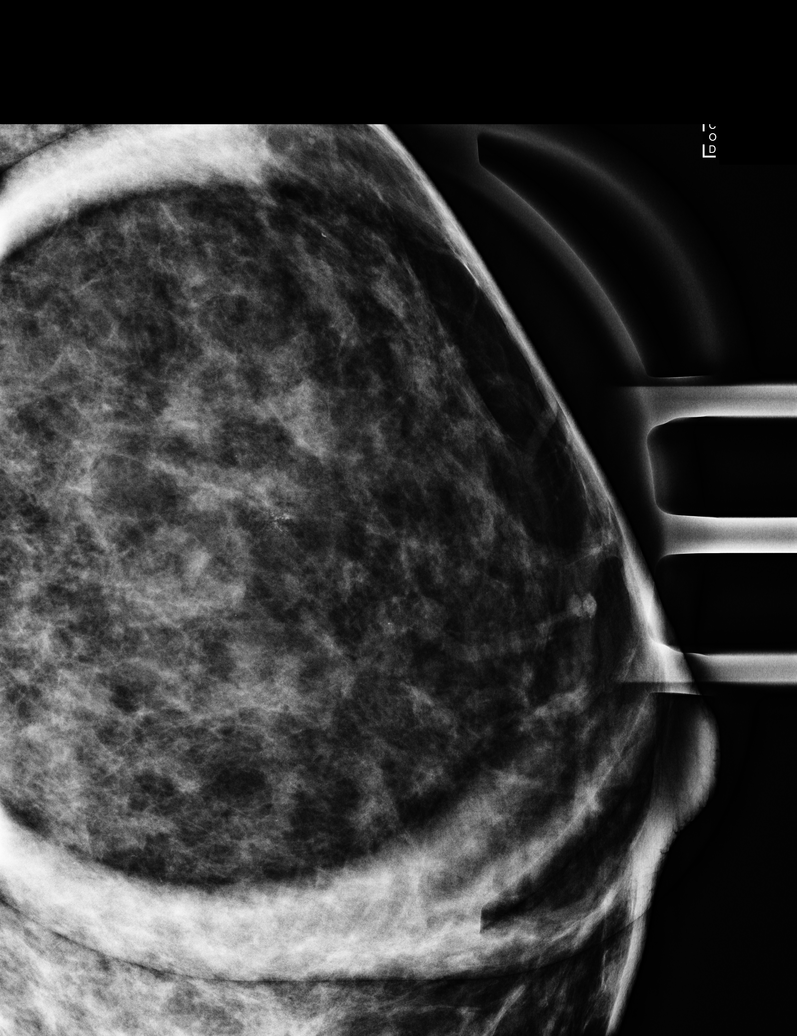

[L CC]
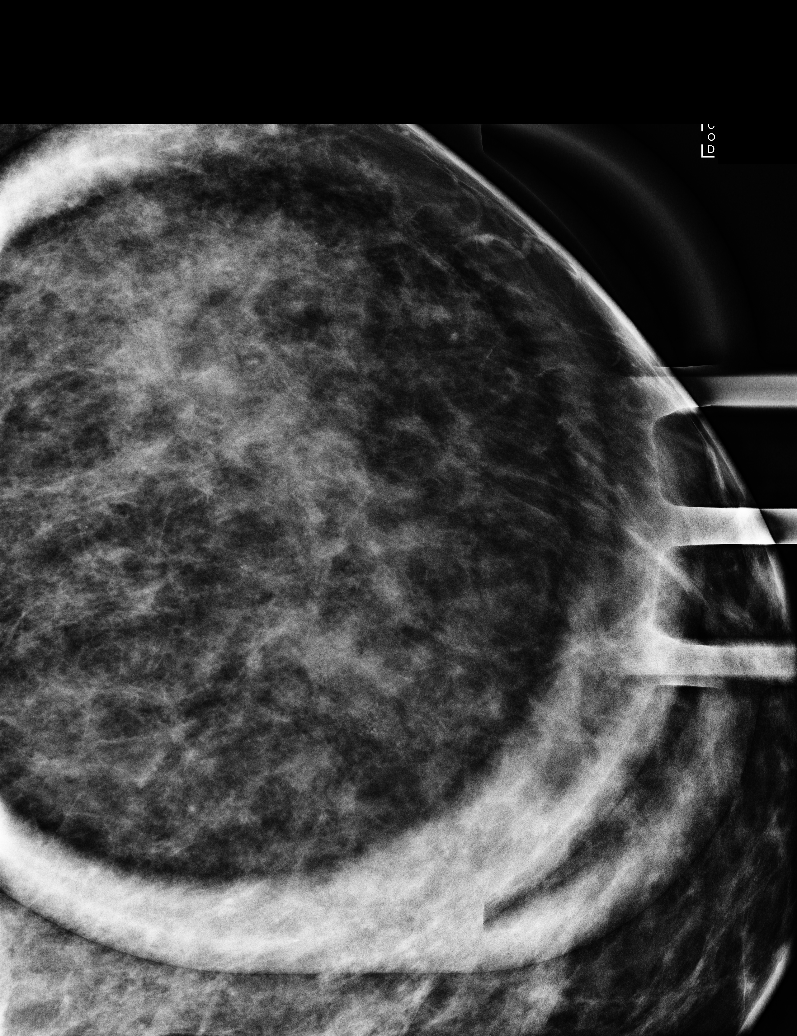

[L CC synth-2D]
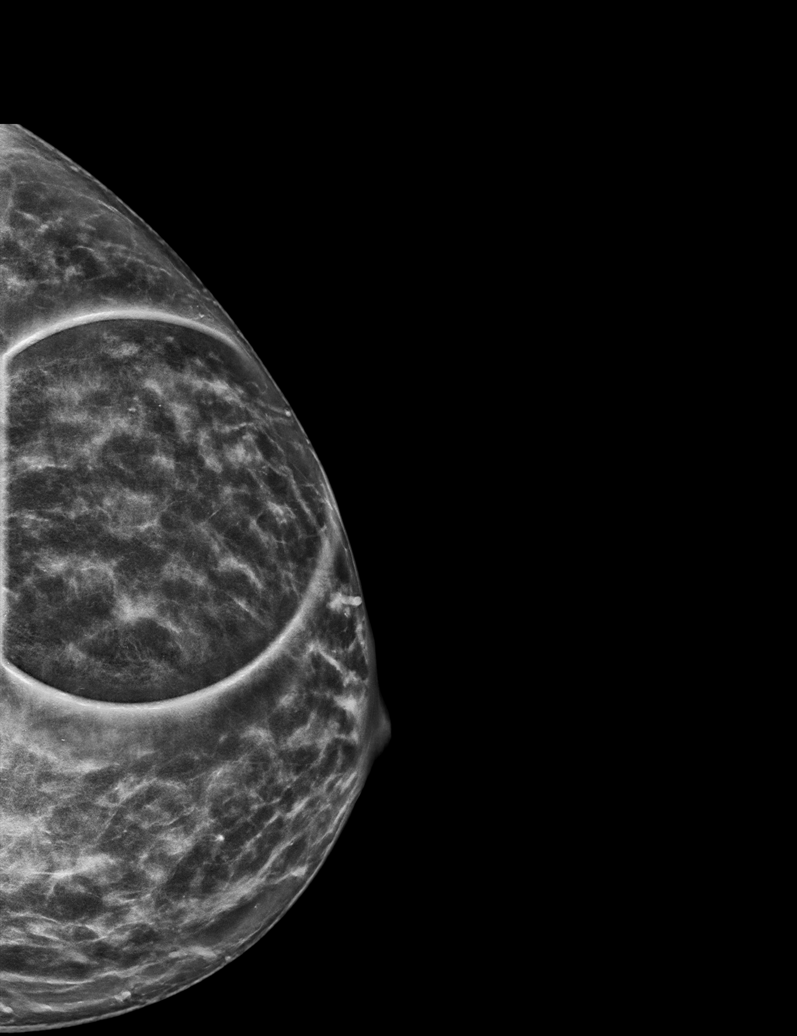

[L MLO synth-2D]
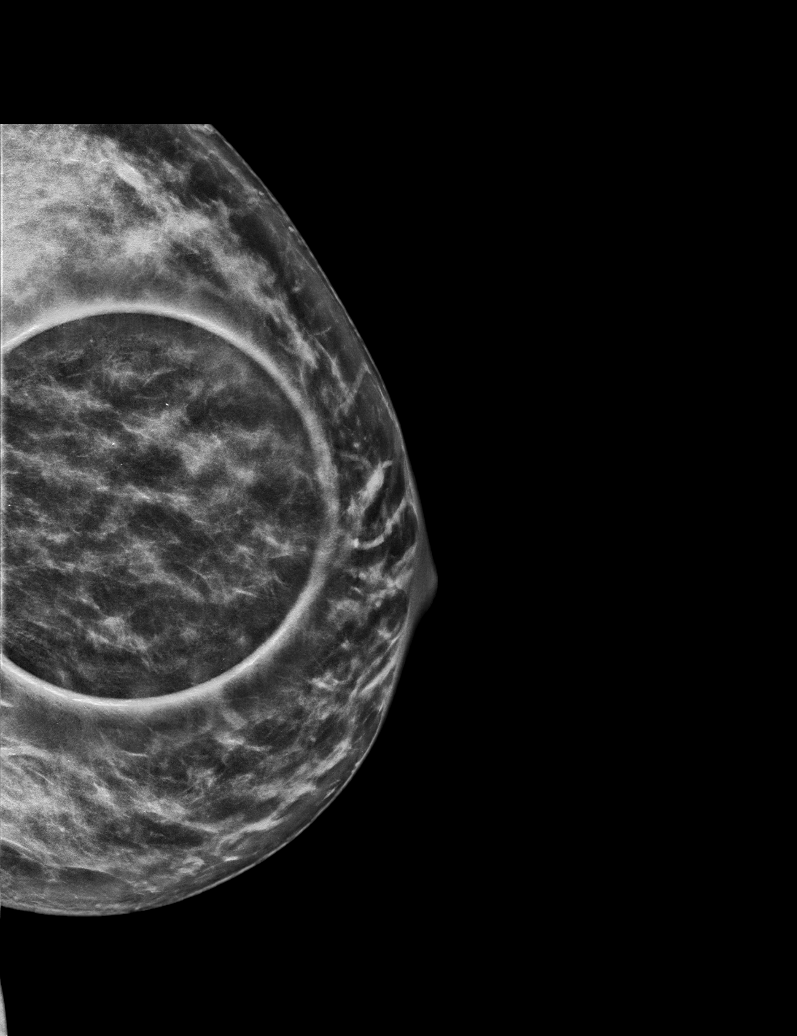

[L CC tomo · tomo slice 33/65.0]
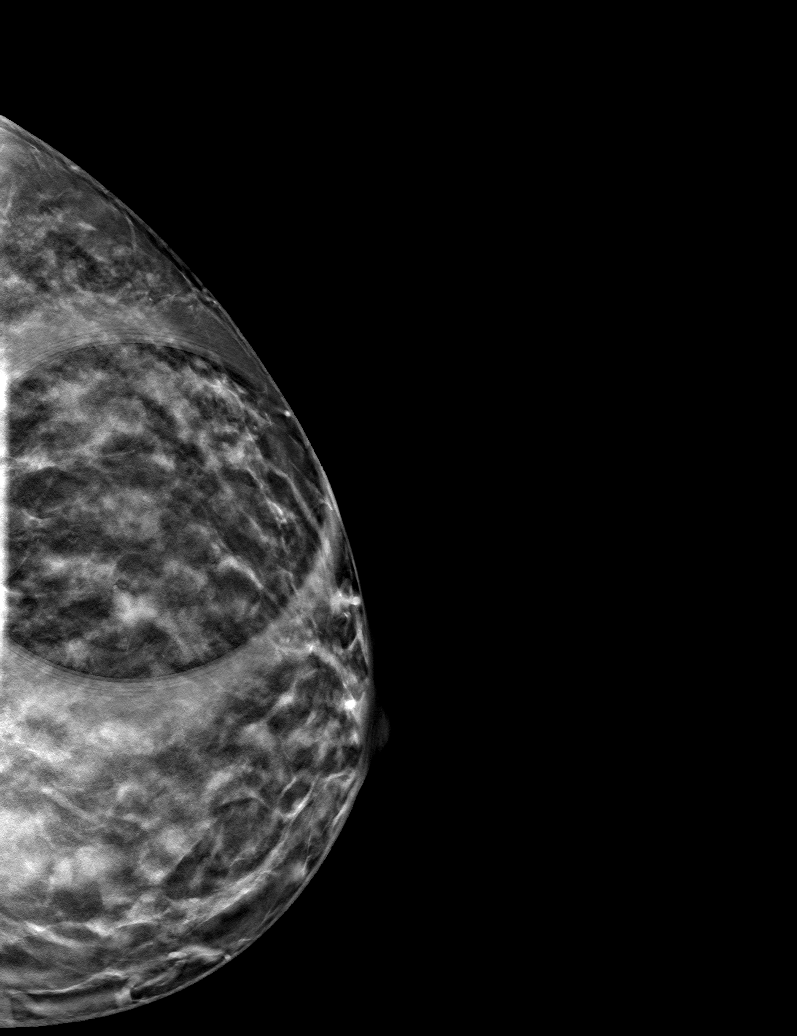

[L MLO tomo · tomo slice 34/67.0]
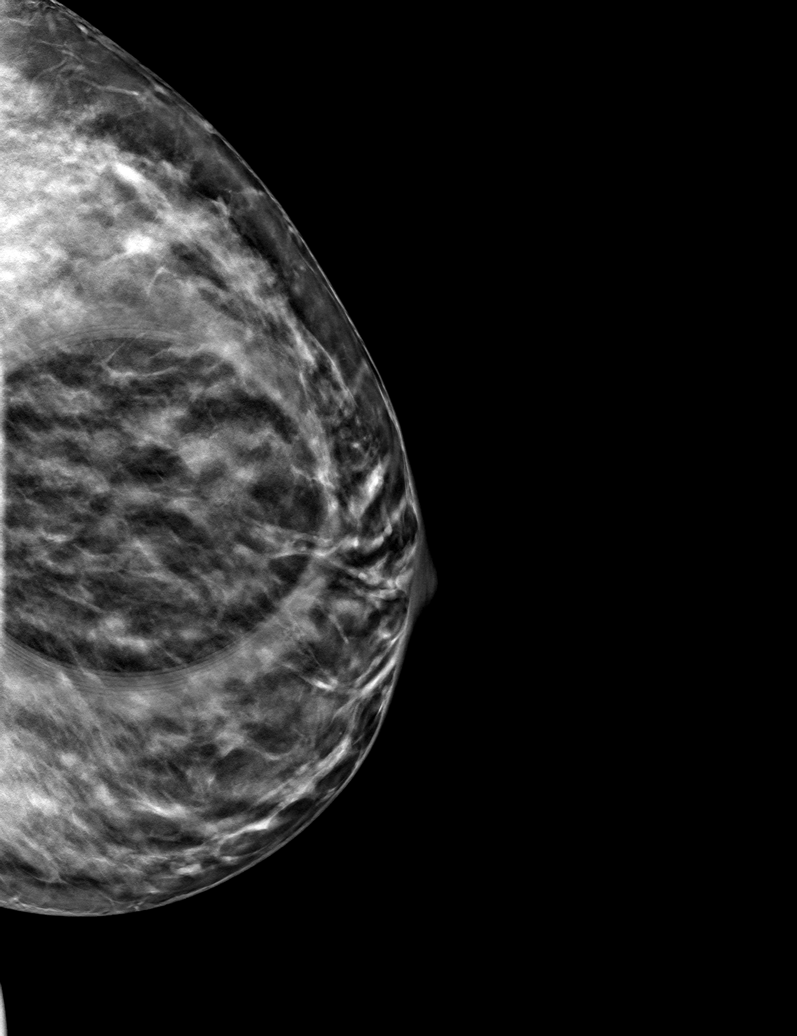

[6 of 14 positions shown; findings below may reference images not displayed]

ACR Breast Density Category c: The breast tissue is heterogeneously
dense, which may obscure small masses.
FINDINGS: 2D and 3D spot compression views of the left breast were obtained as
well as 2D spot magnification views. Normal appearing fibroglandular
tissue is demonstrated at the location of the recently suspected
mass. There is a 3 x 3 x 3 mm group of multiple tiny, pleomorphic
calcifications at that location. This is in the upper, slightly
lateral portion of the breast in the anterior half of the breast.

Mammographic images were processed with CAD.
IMPRESSION: 3 mm group of indeterminate calcifications in the upper-outer
quadrant of the left breast.

RECOMMENDATION:
Stereotactic guided core needle biopsy of the 3 mm group of
calcifications in the upper-outer quadrant of the left breast. This
has been discussed with the patient and scheduled [DATE] p.m. on
09/19/2017.

I have discussed the findings and recommendations with the patient.
Results were also provided in writing at the conclusion of the
visit. If applicable, a reminder letter will be sent to the patient
regarding the next appointment.

BI-RADS CATEGORY  4: Suspicious.

## 2019-07-28 ENCOUNTER — Other Ambulatory Visit: Payer: Self-pay | Admitting: Nurse Practitioner

## 2019-08-01 ENCOUNTER — Other Ambulatory Visit: Payer: BC Managed Care – PPO

## 2020-01-22 ENCOUNTER — Other Ambulatory Visit: Payer: Self-pay | Admitting: Family Medicine

## 2020-01-22 DIAGNOSIS — Z1231 Encounter for screening mammogram for malignant neoplasm of breast: Secondary | ICD-10-CM

## 2020-02-04 ENCOUNTER — Other Ambulatory Visit: Payer: Self-pay

## 2020-02-04 ENCOUNTER — Ambulatory Visit
Admission: RE | Admit: 2020-02-04 | Discharge: 2020-02-04 | Disposition: A | Discharge status: Home / Self Care | Payer: BC Managed Care – PPO | Source: Ambulatory Visit | Attending: Family Medicine | Admitting: Family Medicine

## 2020-02-04 DIAGNOSIS — Z1231 Encounter for screening mammogram for malignant neoplasm of breast: Secondary | ICD-10-CM

## 2020-10-25 ENCOUNTER — Other Ambulatory Visit: Payer: Self-pay | Admitting: Nurse Practitioner

## 2020-10-25 DIAGNOSIS — Z1231 Encounter for screening mammogram for malignant neoplasm of breast: Secondary | ICD-10-CM

## 2021-02-07 ENCOUNTER — Ambulatory Visit
Admission: RE | Admit: 2021-02-07 | Discharge: 2021-02-07 | Disposition: A | Discharge status: Home / Self Care | Payer: BC Managed Care – PPO | Source: Ambulatory Visit | Attending: Nurse Practitioner | Admitting: Nurse Practitioner

## 2021-02-07 ENCOUNTER — Other Ambulatory Visit: Payer: Self-pay

## 2021-02-07 DIAGNOSIS — Z1231 Encounter for screening mammogram for malignant neoplasm of breast: Secondary | ICD-10-CM

## 2021-02-09 ENCOUNTER — Other Ambulatory Visit: Payer: Self-pay | Admitting: Nurse Practitioner

## 2021-02-09 DIAGNOSIS — R928 Other abnormal and inconclusive findings on diagnostic imaging of breast: Secondary | ICD-10-CM

## 2021-02-24 ENCOUNTER — Other Ambulatory Visit: Payer: Self-pay

## 2021-02-24 ENCOUNTER — Other Ambulatory Visit: Payer: Self-pay | Admitting: Nurse Practitioner

## 2021-02-24 ENCOUNTER — Ambulatory Visit
Admission: RE | Admit: 2021-02-24 | Discharge: 2021-02-24 | Disposition: A | Discharge status: Home / Self Care | Payer: BC Managed Care – PPO | Source: Ambulatory Visit | Attending: Nurse Practitioner | Admitting: Nurse Practitioner

## 2021-02-24 DIAGNOSIS — R928 Other abnormal and inconclusive findings on diagnostic imaging of breast: Secondary | ICD-10-CM

## 2021-02-24 DIAGNOSIS — N6489 Other specified disorders of breast: Secondary | ICD-10-CM

## 2021-09-15 ENCOUNTER — Ambulatory Visit
Admission: RE | Admit: 2021-09-15 | Discharge: 2021-09-15 | Disposition: A | Discharge status: Home / Self Care | Payer: BC Managed Care – PPO | Source: Ambulatory Visit | Attending: Nurse Practitioner | Admitting: Nurse Practitioner

## 2021-09-15 ENCOUNTER — Other Ambulatory Visit: Payer: Self-pay

## 2021-09-15 DIAGNOSIS — N6489 Other specified disorders of breast: Secondary | ICD-10-CM

## 2022-01-16 ENCOUNTER — Other Ambulatory Visit: Payer: Self-pay | Admitting: Nurse Practitioner

## 2022-01-16 DIAGNOSIS — Z1231 Encounter for screening mammogram for malignant neoplasm of breast: Secondary | ICD-10-CM

## 2022-02-08 ENCOUNTER — Ambulatory Visit
Admission: RE | Admit: 2022-02-08 | Discharge: 2022-02-08 | Disposition: A | Discharge status: Home / Self Care | Payer: BC Managed Care – PPO | Source: Ambulatory Visit | Attending: Nurse Practitioner | Admitting: Nurse Practitioner

## 2022-02-08 DIAGNOSIS — Z1231 Encounter for screening mammogram for malignant neoplasm of breast: Secondary | ICD-10-CM

## 2023-02-13 ENCOUNTER — Other Ambulatory Visit: Payer: Self-pay | Admitting: Nurse Practitioner

## 2023-02-13 DIAGNOSIS — Z1231 Encounter for screening mammogram for malignant neoplasm of breast: Secondary | ICD-10-CM

## 2023-02-14 ENCOUNTER — Ambulatory Visit
Admission: RE | Admit: 2023-02-14 | Discharge: 2023-02-14 | Disposition: A | Discharge status: Home / Self Care | Payer: BC Managed Care – PPO | Source: Ambulatory Visit | Attending: Nurse Practitioner | Admitting: Nurse Practitioner

## 2023-02-14 DIAGNOSIS — Z1231 Encounter for screening mammogram for malignant neoplasm of breast: Secondary | ICD-10-CM

## 2024-01-17 ENCOUNTER — Other Ambulatory Visit: Payer: Self-pay | Admitting: Nurse Practitioner

## 2024-01-17 DIAGNOSIS — Z1231 Encounter for screening mammogram for malignant neoplasm of breast: Secondary | ICD-10-CM

## 2024-02-15 ENCOUNTER — Ambulatory Visit: Payer: Self-pay

## 2024-02-22 ENCOUNTER — Ambulatory Visit
Admission: RE | Admit: 2024-02-22 | Discharge: 2024-02-22 | Disposition: A | Discharge status: Home / Self Care | Payer: Self-pay | Source: Ambulatory Visit | Attending: Nurse Practitioner | Admitting: Nurse Practitioner

## 2024-02-22 DIAGNOSIS — Z1231 Encounter for screening mammogram for malignant neoplasm of breast: Secondary | ICD-10-CM

## 2024-06-27 ENCOUNTER — Other Ambulatory Visit (HOSPITAL_BASED_OUTPATIENT_CLINIC_OR_DEPARTMENT_OTHER): Payer: Self-pay

## 2024-06-30 ENCOUNTER — Other Ambulatory Visit (HOSPITAL_BASED_OUTPATIENT_CLINIC_OR_DEPARTMENT_OTHER): Payer: Self-pay

## 2024-06-30 MED ORDER — HYDROCHLOROTHIAZIDE 25 MG PO TABS
37.5000 mg | ORAL_TABLET | Freq: Every morning | ORAL | 1 refills | Status: AC
  Filled 2024-06-30 – 2024-07-02 (×2): qty 135, 90d supply, fill #0

## 2024-07-02 ENCOUNTER — Other Ambulatory Visit (HOSPITAL_BASED_OUTPATIENT_CLINIC_OR_DEPARTMENT_OTHER): Payer: Self-pay

## 2024-07-02 MED ORDER — LISINOPRIL 10 MG PO TABS
10.0000 mg | ORAL_TABLET | Freq: Every day | ORAL | 0 refills | Status: AC
  Filled 2024-07-02: qty 90, 90d supply, fill #0
